# Patient Record
Sex: Female | Born: 1955
Health system: Southern US, Community
[De-identification: ages and names within clinical notes are randomized; demographics above are authoritative.]

## PROBLEM LIST (undated history)

## (undated) DIAGNOSIS — T7840XA Allergy, unspecified, initial encounter: Secondary | ICD-10-CM

## (undated) DIAGNOSIS — R112 Nausea with vomiting, unspecified: Secondary | ICD-10-CM

## (undated) DIAGNOSIS — M1990 Unspecified osteoarthritis, unspecified site: Secondary | ICD-10-CM

## (undated) DIAGNOSIS — J329 Chronic sinusitis, unspecified: Secondary | ICD-10-CM

## (undated) DIAGNOSIS — T07XXXA Unspecified multiple injuries, initial encounter: Secondary | ICD-10-CM

## (undated) DIAGNOSIS — K449 Diaphragmatic hernia without obstruction or gangrene: Secondary | ICD-10-CM

## (undated) DIAGNOSIS — K589 Irritable bowel syndrome without diarrhea: Secondary | ICD-10-CM

## (undated) DIAGNOSIS — K222 Esophageal obstruction: Secondary | ICD-10-CM

## (undated) DIAGNOSIS — Z8489 Family history of other specified conditions: Secondary | ICD-10-CM

## (undated) DIAGNOSIS — F419 Anxiety disorder, unspecified: Secondary | ICD-10-CM

## (undated) DIAGNOSIS — K219 Gastro-esophageal reflux disease without esophagitis: Secondary | ICD-10-CM

## (undated) DIAGNOSIS — R159 Full incontinence of feces: Secondary | ICD-10-CM

## (undated) DIAGNOSIS — M810 Age-related osteoporosis without current pathological fracture: Secondary | ICD-10-CM

## (undated) DIAGNOSIS — F32A Depression, unspecified: Secondary | ICD-10-CM

## (undated) DIAGNOSIS — F1910 Other psychoactive substance abuse, uncomplicated: Secondary | ICD-10-CM

## (undated) DIAGNOSIS — R1310 Dysphagia, unspecified: Secondary | ICD-10-CM

## (undated) DIAGNOSIS — J302 Other seasonal allergic rhinitis: Secondary | ICD-10-CM

## (undated) DIAGNOSIS — M8580 Other specified disorders of bone density and structure, unspecified site: Secondary | ICD-10-CM

## (undated) DIAGNOSIS — R768 Other specified abnormal immunological findings in serum: Secondary | ICD-10-CM

## (undated) DIAGNOSIS — T8859XA Other complications of anesthesia, initial encounter: Secondary | ICD-10-CM

## (undated) DIAGNOSIS — Z9889 Other specified postprocedural states: Secondary | ICD-10-CM

## (undated) DIAGNOSIS — R569 Unspecified convulsions: Secondary | ICD-10-CM

## (undated) DIAGNOSIS — I10 Essential (primary) hypertension: Secondary | ICD-10-CM

## (undated) DIAGNOSIS — G40909 Epilepsy, unspecified, not intractable, without status epilepticus: Secondary | ICD-10-CM

## (undated) DIAGNOSIS — K5790 Diverticulosis of intestine, part unspecified, without perforation or abscess without bleeding: Secondary | ICD-10-CM

## (undated) DIAGNOSIS — T4145XA Adverse effect of unspecified anesthetic, initial encounter: Secondary | ICD-10-CM

## (undated) DIAGNOSIS — F329 Major depressive disorder, single episode, unspecified: Secondary | ICD-10-CM

## (undated) DIAGNOSIS — M069 Rheumatoid arthritis, unspecified: Secondary | ICD-10-CM

## (undated) DIAGNOSIS — M797 Fibromyalgia: Secondary | ICD-10-CM

## (undated) HISTORY — DX: Major depressive disorder, single episode, unspecified: F32.9

## (undated) HISTORY — PX: EYE SURGERY: SHX253

## (undated) HISTORY — PX: RECTOCELE REPAIR: SHX761

## (undated) HISTORY — DX: Allergy, unspecified, initial encounter: T78.40XA

## (undated) HISTORY — DX: Fibromyalgia: M79.7

## (undated) HISTORY — DX: Unspecified convulsions: R56.9

## (undated) HISTORY — DX: Unspecified osteoarthritis, unspecified site: M19.90

## (undated) HISTORY — DX: Other seasonal allergic rhinitis: J30.2

## (undated) HISTORY — DX: Anxiety disorder, unspecified: F41.9

## (undated) HISTORY — DX: Gastro-esophageal reflux disease without esophagitis: K21.9

## (undated) HISTORY — PX: NOSE SURGERY: SHX723

## (undated) HISTORY — DX: Esophageal obstruction: K22.2

## (undated) HISTORY — DX: Depression, unspecified: F32.A

## (undated) HISTORY — PX: TUBAL LIGATION: SHX77

## (undated) HISTORY — DX: Rheumatoid arthritis, unspecified: M06.9

## (undated) HISTORY — DX: Other specified abnormal immunological findings in serum: R76.8

## (undated) HISTORY — PX: OTHER SURGICAL HISTORY: SHX169

## (undated) HISTORY — DX: Unspecified multiple injuries, initial encounter: T07.XXXA

## (undated) HISTORY — PX: BACK SURGERY: SHX140

## (undated) HISTORY — DX: Dysphagia, unspecified: R13.10

## (undated) HISTORY — DX: Other specified disorders of bone density and structure, unspecified site: M85.80

## (undated) HISTORY — DX: Epilepsy, unspecified, not intractable, without status epilepticus: G40.909

## (undated) HISTORY — PX: LAPAROSCOPIC HYSTERECTOMY: SHX1926

## (undated) HISTORY — DX: Age-related osteoporosis without current pathological fracture: M81.0

## (undated) HISTORY — PX: APPENDECTOMY: SHX54

---

## 2001-10-06 ENCOUNTER — Encounter: Payer: Self-pay | Admitting: Obstetrics and Gynecology

## 2001-10-06 ENCOUNTER — Ambulatory Visit (HOSPITAL_COMMUNITY): Admission: RE | Admit: 2001-10-06 | Discharge: 2001-10-06 | Payer: Self-pay | Admitting: Obstetrics and Gynecology

## 2001-10-12 ENCOUNTER — Encounter (HOSPITAL_COMMUNITY): Admission: RE | Admit: 2001-10-12 | Discharge: 2001-11-11 | Payer: Self-pay | Admitting: Rheumatology

## 2002-06-19 ENCOUNTER — Ambulatory Visit (HOSPITAL_COMMUNITY): Admission: RE | Admit: 2002-06-19 | Discharge: 2002-06-19 | Payer: Self-pay | Admitting: Family Medicine

## 2002-06-19 ENCOUNTER — Encounter: Payer: Self-pay | Admitting: Family Medicine

## 2004-10-16 ENCOUNTER — Ambulatory Visit: Payer: Self-pay | Admitting: Internal Medicine

## 2004-10-20 ENCOUNTER — Ambulatory Visit (HOSPITAL_COMMUNITY): Admission: RE | Admit: 2004-10-20 | Discharge: 2004-10-20 | Payer: Self-pay | Admitting: Internal Medicine

## 2004-10-20 ENCOUNTER — Ambulatory Visit: Payer: Self-pay | Admitting: Internal Medicine

## 2006-07-23 ENCOUNTER — Ambulatory Visit: Payer: Self-pay | Admitting: Internal Medicine

## 2006-07-23 ENCOUNTER — Emergency Department (HOSPITAL_COMMUNITY): Admission: EM | Admit: 2006-07-23 | Discharge: 2006-07-23 | Payer: Self-pay | Admitting: Emergency Medicine

## 2006-07-29 ENCOUNTER — Ambulatory Visit (HOSPITAL_COMMUNITY): Admission: RE | Admit: 2006-07-29 | Discharge: 2006-07-29 | Payer: Self-pay | Admitting: Internal Medicine

## 2006-07-29 ENCOUNTER — Ambulatory Visit: Payer: Self-pay | Admitting: Internal Medicine

## 2006-10-27 ENCOUNTER — Ambulatory Visit (HOSPITAL_COMMUNITY): Admission: RE | Admit: 2006-10-27 | Discharge: 2006-10-27 | Payer: Self-pay | Admitting: Family Medicine

## 2007-03-15 ENCOUNTER — Ambulatory Visit: Payer: Self-pay | Admitting: Internal Medicine

## 2007-03-30 ENCOUNTER — Ambulatory Visit (HOSPITAL_COMMUNITY): Admission: RE | Admit: 2007-03-30 | Discharge: 2007-03-30 | Payer: Self-pay | Admitting: Family Medicine

## 2007-05-24 ENCOUNTER — Ambulatory Visit (HOSPITAL_COMMUNITY): Admission: RE | Admit: 2007-05-24 | Discharge: 2007-05-24 | Payer: Self-pay | Admitting: Family Medicine

## 2008-03-13 ENCOUNTER — Ambulatory Visit (HOSPITAL_COMMUNITY): Admission: RE | Admit: 2008-03-13 | Discharge: 2008-03-13 | Payer: Self-pay | Admitting: Family Medicine

## 2008-09-25 ENCOUNTER — Encounter: Admission: RE | Admit: 2008-09-25 | Discharge: 2008-09-25 | Payer: Self-pay | Admitting: Neurosurgery

## 2008-10-23 ENCOUNTER — Inpatient Hospital Stay (HOSPITAL_COMMUNITY): Admission: RE | Admit: 2008-10-23 | Discharge: 2008-10-26 | Payer: Self-pay | Admitting: Neurosurgery

## 2008-10-31 ENCOUNTER — Emergency Department (HOSPITAL_COMMUNITY): Admission: EM | Admit: 2008-10-31 | Discharge: 2008-10-31 | Payer: Self-pay | Admitting: Emergency Medicine

## 2009-04-23 ENCOUNTER — Emergency Department (HOSPITAL_COMMUNITY): Admission: EM | Admit: 2009-04-23 | Discharge: 2009-04-23 | Payer: Self-pay | Admitting: Emergency Medicine

## 2009-05-05 ENCOUNTER — Telehealth (INDEPENDENT_AMBULATORY_CARE_PROVIDER_SITE_OTHER): Payer: Self-pay

## 2009-05-08 ENCOUNTER — Telehealth (INDEPENDENT_AMBULATORY_CARE_PROVIDER_SITE_OTHER): Payer: Self-pay

## 2009-05-16 DIAGNOSIS — K219 Gastro-esophageal reflux disease without esophagitis: Secondary | ICD-10-CM | POA: Insufficient documentation

## 2009-05-16 DIAGNOSIS — R109 Unspecified abdominal pain: Secondary | ICD-10-CM | POA: Insufficient documentation

## 2009-05-16 DIAGNOSIS — R1110 Vomiting, unspecified: Secondary | ICD-10-CM

## 2009-05-16 DIAGNOSIS — M949 Disorder of cartilage, unspecified: Secondary | ICD-10-CM

## 2009-05-16 DIAGNOSIS — F329 Major depressive disorder, single episode, unspecified: Secondary | ICD-10-CM

## 2009-05-16 DIAGNOSIS — K319 Disease of stomach and duodenum, unspecified: Secondary | ICD-10-CM

## 2009-05-16 DIAGNOSIS — J301 Allergic rhinitis due to pollen: Secondary | ICD-10-CM | POA: Insufficient documentation

## 2009-05-16 DIAGNOSIS — M899 Disorder of bone, unspecified: Secondary | ICD-10-CM | POA: Insufficient documentation

## 2009-05-16 DIAGNOSIS — R110 Nausea: Secondary | ICD-10-CM

## 2009-05-16 DIAGNOSIS — K449 Diaphragmatic hernia without obstruction or gangrene: Secondary | ICD-10-CM | POA: Insufficient documentation

## 2009-05-20 ENCOUNTER — Telehealth (INDEPENDENT_AMBULATORY_CARE_PROVIDER_SITE_OTHER): Payer: Self-pay

## 2009-11-23 ENCOUNTER — Emergency Department (HOSPITAL_COMMUNITY): Admission: EM | Admit: 2009-11-23 | Discharge: 2009-11-23 | Payer: Self-pay | Admitting: Emergency Medicine

## 2010-02-22 ENCOUNTER — Encounter: Payer: Self-pay | Admitting: Family Medicine

## 2010-03-05 NOTE — Progress Notes (Signed)
Summary: phone note  Phone Note Call from Patient   Caller: Patient Summary of Call: Pt said she is having some trouble at times with swallowing and coughs. She wants to know if she might could be inflamed in her esophagus and would need some antibiotics. She has appt here 05/19/2009. I told her she should see her PCP, who is Dr. Phillips Odor.  Initial call taken by: Cloria Spring LPN,  May 08, 2009 3:08 PM

## 2010-03-05 NOTE — Progress Notes (Signed)
Summary: pt vomiting/ very sick per sister  Phone Note Call from Patient   Caller: Pt's sister/ Luna Kitchens Summary of Call: Pt's sister, Dorisann Frames and said she is very concerned about the pt. Said she was seen in ER recently and referred to our office. ( Has appt with LL on 05/19/2009) She said the pt's dx was alcohol abuse and elevated LFT's. She said she just spoke with pt and said she is very sick, said she has vomited over a 5 gal can all over wall, floor, etc. I told her she  should go to the ER  @ once if she has vomited that much. She asked could we get her in earlier, and i told her we would probably be notified from ER, and we would see what we could do. York Spaniel she knows her sister's life style is not good but it is still her sister and she is concerned about her). Initial call taken by: Cloria Spring LPN,  May 05, 2009 4:16 PM

## 2010-03-05 NOTE — Progress Notes (Signed)
Summary: PT DOES NOT WANT ANY INFO SHARED WITH SISTER JEAN STEVENSON  Phone Note Call from Patient   Caller: Patient Summary of Call: VM FROM PT, SAID SHE DOES NOT WANT ANY OF HER INFORMATION SHARED WITH HER SISTER, JEAN STEVENSON. Initial call taken by: Cloria Spring LPN,  May 20, 2009 8:33 AM     Appended Document: PT DOES NOT WANT ANY INFO SHARED WITH SISTER JEAN STEVENSON OK, we need to put HIPAA flag on chart AND in computer please

## 2010-04-27 LAB — COMPREHENSIVE METABOLIC PANEL
AST: 257 U/L — ABNORMAL HIGH (ref 0–37)
CO2: 24 mEq/L (ref 19–32)
Calcium: 9.3 mg/dL (ref 8.4–10.5)
Chloride: 99 mEq/L (ref 96–112)
Creatinine, Ser: 0.75 mg/dL (ref 0.4–1.2)
GFR calc Af Amer: >60 mL/min (ref >60)
GFR calc non Af Amer: >60 mL/min (ref >60)
Glucose, Bld: 94 mg/dL (ref 70–99)
Total Bilirubin: 0.7 mg/dL (ref 0.3–1.2)

## 2010-04-27 LAB — URINALYSIS, ROUTINE W REFLEX MICROSCOPIC
Bilirubin Urine: NEGATIVE
Hgb urine dipstick: NEGATIVE
Ketones, ur: NEGATIVE mg/dL
Nitrite: NEGATIVE
Protein, ur: NEGATIVE mg/dL
Specific Gravity, Urine: 1.005 (ref 1.005–1.030)
Urobilinogen, UA: 0.2 mg/dL (ref 0.0–1.0)

## 2010-04-27 LAB — CBC
HCT: 38.2 % (ref 36.0–46.0)
Hemoglobin: 13.5 g/dL (ref 12.0–15.0)
MCHC: 35.3 g/dL (ref 30.0–36.0)
MCV: 94.9 fL (ref 78.0–100.0)
RBC: 4.02 MIL/uL (ref 3.87–5.11)
WBC: 5.2 10*3/uL (ref 4.0–10.5)

## 2010-04-27 LAB — URINE MICROSCOPIC-ADD ON

## 2010-04-27 LAB — DIFFERENTIAL
Basophils Relative: 1 % (ref 0–1)
Eosinophils Absolute: 0.1 10*3/uL (ref 0.0–0.7)
Eosinophils Relative: 2 % (ref 0–5)
Lymphs Abs: 1.7 10*3/uL (ref 0.7–4.0)
Monocytes Absolute: 0.5 10*3/uL (ref 0.1–1.0)
Monocytes Relative: 10 % (ref 3–12)

## 2010-05-08 LAB — TYPE AND SCREEN: ABO/RH(D): O POS

## 2010-05-08 LAB — DIFFERENTIAL
Basophils Relative: 1 % (ref 0–1)
Lymphs Abs: 0.8 10*3/uL (ref 0.7–4.0)
Monocytes Absolute: 0.8 10*3/uL (ref 0.1–1.0)
Monocytes Relative: 16 % — ABNORMAL HIGH (ref 3–12)
Neutro Abs: 2.9 10*3/uL (ref 1.7–7.7)

## 2010-05-08 LAB — BASIC METABOLIC PANEL
BUN: 4 mg/dL — ABNORMAL LOW (ref 6–23)
BUN: 8 mg/dL (ref 6–23)
Calcium: 9 mg/dL (ref 8.4–10.5)
Chloride: 101 mEq/L (ref 96–112)
Creatinine, Ser: 0.61 mg/dL (ref 0.4–1.2)
Creatinine, Ser: 0.81 mg/dL (ref 0.4–1.2)
GFR calc Af Amer: >60 mL/min (ref >60)
GFR calc non Af Amer: >60 mL/min (ref >60)
GFR calc non Af Amer: >60 mL/min (ref >60)
Glucose, Bld: 103 mg/dL — ABNORMAL HIGH (ref 70–99)
Potassium: 4.2 mEq/L (ref 3.5–5.1)

## 2010-05-08 LAB — ABO/RH: ABO/RH(D): O POS

## 2010-05-08 LAB — CBC
HCT: 38.4 % (ref 36.0–46.0)
Platelets: 187 10*3/uL (ref 150–400)
Platelets: 447 10*3/uL — ABNORMAL HIGH (ref 150–400)
RBC: 3.96 MIL/uL (ref 3.87–5.11)
RDW: 13 % (ref 11.5–15.5)
WBC: 4.6 10*3/uL (ref 4.0–10.5)
WBC: 4.9 10*3/uL (ref 4.0–10.5)

## 2010-05-08 LAB — RAPID URINE DRUG SCREEN, HOSP PERFORMED
Amphetamines: NOT DETECTED
Cocaine: POSITIVE — AB
Tetrahydrocannabinol: NOT DETECTED

## 2010-05-08 LAB — ETHANOL: Alcohol, Ethyl (B): <5 mg/dL (ref 0–10)

## 2010-06-04 ENCOUNTER — Emergency Department (HOSPITAL_COMMUNITY)
Admission: EM | Admit: 2010-06-04 | Discharge: 2010-06-04 | Disposition: A | Discharge status: Home / Self Care | Payer: Medicare Other | Attending: Emergency Medicine | Admitting: Emergency Medicine

## 2010-06-04 DIAGNOSIS — F411 Generalized anxiety disorder: Secondary | ICD-10-CM | POA: Insufficient documentation

## 2010-06-04 DIAGNOSIS — K219 Gastro-esophageal reflux disease without esophagitis: Secondary | ICD-10-CM | POA: Insufficient documentation

## 2010-06-04 DIAGNOSIS — M129 Arthropathy, unspecified: Secondary | ICD-10-CM | POA: Insufficient documentation

## 2010-06-04 DIAGNOSIS — G8929 Other chronic pain: Secondary | ICD-10-CM | POA: Insufficient documentation

## 2010-06-04 DIAGNOSIS — G40909 Epilepsy, unspecified, not intractable, without status epilepticus: Secondary | ICD-10-CM | POA: Insufficient documentation

## 2010-06-04 DIAGNOSIS — M549 Dorsalgia, unspecified: Secondary | ICD-10-CM | POA: Insufficient documentation

## 2010-06-04 DIAGNOSIS — Z79899 Other long term (current) drug therapy: Secondary | ICD-10-CM | POA: Insufficient documentation

## 2010-06-04 DIAGNOSIS — L259 Unspecified contact dermatitis, unspecified cause: Secondary | ICD-10-CM | POA: Insufficient documentation

## 2010-06-16 NOTE — Op Note (Signed)
NAME:  [name], [name]              ACCOUNT NO.:  [id number]   MEDICAL RECORD NO.:  [id number]          PATIENT TYPE:  AMB   LOCATION:  DAY                           FACILITY:  [facility]   PHYSICIAN:  R. Roetta Sessions, M.D. DATE OF BIRTH:  [birth year]   DATE OF PROCEDURE:  07/29/2006  DATE OF DISCHARGE:                               OPERATIVE REPORT   PROCEDURE:  EGD with balloon dilation.   ENDOSCOPIST:  Jonathon Bellows, MD.   INDICATIONS FOR PROCEDURE:  The patient is a 55 year old lady with a  Schatzki's ring.  This has given her a food impaction.  She was seen  emergently by me last weekend and was disimpacted.  She returns for EGD  with an elective dilation.  This approach has been discussed with the  patient, the potential risks, benefits, and alternatives have been  reviewed, questions answered, and she is agreeable.  Please the  documentation in the medical record.   PROCEDURE NOTE:  O2 saturation, blood pressure, pulse, and respirations  were monitored throughout the entire procedure, conscious sedation with  Versed 5 mg IV and Demerol 125 mg IV in divided doses, Cetacaine spray  for topical oropharyngeal anesthesia, and a Pentax video chip system.   FINDINGS:  Examination of the tubular esophagus again revealed a  Schatzki's ring.  There were also some distal esophageal erosions as  well.  There was no Barrett's esophagus.  The EG junction was easily  traversed entering the stomach and colon, and the gastric cavity was  empty.  __________ examination of the gastric mucosa.  Retroflexion of  the proximal stomach and esophagogastric junction demonstrated only a  small hiatal hernia, the pylorus was patent and easily traversed,  examination of the bulb and second portion revealed no abnormalities.   THERAPEUTIC/DIAGNOSTIC MANEUVERS PERFORMED:  The scope was withdrawn,  and a #56 [place] Maloney dilator was passed to full insertion with some  slight resistance.  Upon full  insertion revealed the ring had been  disrupted, and there was a 3 cm superficial tear extending from the  level of the ring proximally.  This proved to be superficial.  The  patient tolerated the procedure well.   IMPRESSION:  Schatzki's ring, status post dilation and disruption as  described above.  Otherwise, distal esophageal erosions consistent with  mild erosive reflux esophagitis, otherwise normal esophagus with a small  hiatal hernia, otherwise a normal stomach, D1 and D2.   The patient takes Nexium 20 mg orally a day.  I have asked her to  increase the Nexium to 40 mg orally daily, Carafate 1 gm Slurries q.i.d.  x5 days, Lortab Elixir 7.5 mg per 5 ml, 50 ml dispensed, one teaspoon  orally q.6h p.r.n. transient discomfort, a clear liquid diet only today,  advance as tolerated tomorrow.  Will plan to see this nice lady back in  the office in three months to see how things are going.      Jonathon Bellows, M.D.  Electronically Signed     RMR/MEDQ  D:  07/29/2006  T:  07/29/2006  Job:  [id number]  cc:   Halford Chessman, M.D.  Fax: [phone]

## 2010-06-16 NOTE — Op Note (Signed)
NAME:  [name] [name], [name] [name] NO.:  [id number]   MEDICAL RECORD NO.:  [id number]          PATIENT TYPE:  EMS   LOCATION:  ED                            FACILITY:  [facility]   PHYSICIAN:  R. Roetta Sessions, M.D. DATE OF BIRTH:  [birth year]   DATE OF PROCEDURE:  07/23/2006  DATE OF DISCHARGE:                               OPERATIVE REPORT   PROCEDURE:  Emergency EGD with esophageal food impaction removal.   INDICATIONS FOR PROCEDURE:  The patient is a 55 year old lady with  chronic esophageal dysphagia, history of a known Schatzki's ring  requiring dilation in the past.  She was eating sandwich today at  lunchtime, swallowed a bolus and felt it catch behind her breast.  She  has not been able to even  swallow saliva since that time.  She came to  see Dr. Margretta Ditty in the ED, who called me.  An emergency EGD with food  disimpaction now being performed.  This approach has been discussed with  the patient at length.  The potential risks, benefits and alternatives  have been reviewed and questions answered.  She is agreeable.  Please  see entire history in the medical record.   PROCEDURE NOTE:  Oxygen saturation, blood pressure, pulse and  respirations were monitored throughout the entire procedure.  Conscious  sedation was with Versed 3 mg IV and Demerol 75 mg IV in divided doses.  Cetacaine spray gargled and swallowed.   INSTRUMENT:  Pentax video chip system.   FINDINGS:  The esophageal compartment was easily intubated.  There was  food bolus and quite a bit of small food debris ball-valving in the  distal esophagus.  Some of this was quite fluid and was suctioned  through the scope.  We approximated the tip of the scope down to the  obstructing bolus, and with gentle pressure  easily pushed and avulsed  across the EG junction into the stomach.  There was quite a bit of food  in the stomach.  Complete examination was not carried out.  The patient  had a hiatal hernia near the  Schatzki's ring.  The tubular esophagus was  opened up with this maneuver without difficulty.  I saw a Schatzki's  ring and nothing else.  I really did not see a V-line esophagus or any  rings, aside from the ring at the EG junction.  The patient tolerated  the procedure well and was reacted in endoscopy.   IMPRESSION:  1. Esophageal food impaction, status post disimpaction as described      above.  2. Schatzki's ring.  3. Hiatal hernia.  4. Complete exam not carried out.   RECOMMENDATIONS:  1. Continue Nexium 40 mg orally daily.  2. Full liquid diet.  3. Will make arrangements for an elective EGD with esophageal dilation      in the near future.      Jonathon Bellows, M.D.  Electronically Signed     RMR/MEDQ  D:  07/23/2006  T:  07/23/2006  Job:  [id number]   cc:   Dr. Phillips Odor

## 2010-06-16 NOTE — Assessment & Plan Note (Signed)
NAME:  [name] [name], [name] [name]               CHART#:  [id number]   DATE:  03/15/2007                       DOB:  03-26-1955   OFFICE FOLLOWUP:  Followup GERD.  History of peptic stricture,  Schatzki's ring, status post dilation summer 2008.  Ms. Pitones has had  problems with her insurance.  They will not pay for the EGD or hospital  charges given it was a pre-existing condition.  She has not been able  to get any acid suppression therapy either because of high copays or no  copays.  She has been taking over-the-counter acid blocker agents and  taking her husband's Prevacid.  She has intermittent esophageal  dysphagia and transient food impactions on the order of a couple of  times monthly.  She has gained 10 pounds over the past year or so.  She  underwent an EGD with Elease Hashimoto dilation (56 [place]) back on 07/29/2006.  She ran out of Nexium months ago.   CURRENT MEDICATIONS:  See updated list.   ALLERGIES:  Sulfa, penicillin.   EXAMINATION:  Today no acute distress.  Weight 168, height 5 feet 3  inches, temperature 98.1, blood pressure 122/80 and pulse 80.  SKIN:  Warm and dry.  CHEST:  Lungs are clear to auscultation.  HEART:  Regular rate and rhythm without murmur, gallop, rub.  ABDOMEN:  Nondistended.  Positive bowel sounds.  Soft, nontender without  appreciable mass or organomegaly.  EXTREMITIES:  No edema.   ASSESSMENT:  Poorly controlled complicated gastroesophageal reflux  disease.  History of peptic stricture/ring.  I stressed the importance  of being on acid suppression therapy with a proton pump inhibitor  indefinitely.  I have given her 30 days' worth of Zegerid 40 mg once  daily.  Apparently her insurance will provide good benefit for generic.  Start her on some Protonix 40 mg early daily #30 with 5 months refills.  Unless something comes up, plan to see this nice lady  back in six months.  Should her dysphagia worsen, she is to let me know  and she may need a repeat EGD with  dilation.       Jonathon Bellows, M.D.  Electronically Signed     RMR/MEDQ  D:  03/15/2007  T:  03/16/2007  Job:  [id number]   cc:   Patrica Duel, M.D.

## 2010-06-19 NOTE — Consult Note (Signed)
NAME:  [name] [name], [name] [name]              ACCOUNT NO.:  [id number]   MEDICAL RECORD NO.:  [id number]         PATIENT TYPE:  AMB   LOCATION:                                FACILITY:  [facility]   PHYSICIAN:  R. Roetta Sessions, M.D. DATE OF BIRTH:  12-06-1955   DATE OF CONSULTATION:  DATE OF DISCHARGE:                                   CONSULTATION   REQUESTING PHYSICIAN:  Melony Overly, PA and Dr. Nobie Putnam.   REASON FOR CONSULTATION:  Dysphagia, intractable belching, refractory  heartburn and rectal bleeding.   HISTORY OF PRESENT ILLNESS:  [name] [name] is a 55 year old Caucasian female  referred through Dr. Geanie Logan office with a 57-month history of intractable  belching.  She also has a history of intermittent dysphagia with both solids  and liquids with subsequent regurgitation.  She complains of sternal  pressure with swallowing that generally lasts anywhere from minutes to hours  until she regurgitates her food.  She is having episodes once every couple  of weeks of this.  She also complains of refractory heartburn and  indigestion.  She has been tried on Nexium, Prilosec and Aciphex, all with  minimal relief.  She has never had an EGD.  She has had chronic GERD  symptoms for about 10 years.   She also complains of noticing burgundy-colored blood in her stool on at  least three occasions along with some crampy left-sided abdominal pain.  She  denies any clots.  She does have a history of chronic constipation and is  taking stool softeners 2 per day as well as an over-the-counter fiber  supplement.  She denies any history of diarrhea.   PAST MEDICAL HISTORY:  Epilepsy, depression, seasonal allergies, chronic  GERD, osteopenia, nasal reconstruction, eye surgery, tubal ligation.  She  believes she has had a complete hysterectomy but she is unsure and  appendectomy at age 55.  Hiatal hernia.   CURRENT MEDICATIONS:  1.  Primidone 250 mg t.i.d.  2.  __________ Depot 7.5 mg t.i.d.  3.   Flexeril 10 mg t.i.d.  4.  Stool softeners 2 daily.  5.  [employer] fiber supplement once daily.   ALLERGIES:  PENICILLIN and SULFA.   FAMILY HISTORY:  No known family history of colorectal carcinoma, liver or  chronic GI problems.  Mother deceased at age 66 secondary to Alzheimer's.  Father deceased at age 43 secondary to leukemia.  She has two sisters, one  with coronary artery disease and arthritis.   SOCIAL HISTORY:  Mrs. Geisler is currently in her fourth marriage and has  been married for 3 years.  She has one grown, healthy child.  She is  employed full time with [employer].  She denies any tobacco or drug use.  She reports occasional alcohol use.   REVIEW OF SYSTEMS:  CONSTITUTIONAL:  Weight is steadily increasing.  She is  complaining of some fatigue.  She denies any fever or chills.  She denies  any early satiety.  CARDIOVASCULAR:  See HPI.  She denies any palpitations.  PULMONARY:  Denies shortness of breath, dyspnea, cough or hemoptysis.  GI:  See HPI.   PHYSICAL EXAMINATION:  VITAL SIGNS:  Weight 158.5 pounds, height 63 inches,  temperature 98.2, blood pressure 112/68.  Pulse 96.  GENERAL:  [name] [name] is a 55 year old Caucasian female who throughout the  exam continues to have intractable belching.  She is alert, oriented,  pleasant and cooperative.  HEENT:  Sclerae are clear and nonicteric.  Conjunctivae are pink.  Oropharynx pink and moist without any lesions.  NECK:  Supple without any mass or thyromegaly.  CHEST/HEART:  Regular rate and rhythm.  Normal S1, S2 without murmurs, rubs  or gallops.  LUNGS:  Clear to auscultation bilaterally.  ABDOMEN:  Positive bowel sounds times four.  No bruits are auscultated.  Soft, nondistended.  She does have mild tenderness of the left lower  quadrant on deep palpation.  No rebound tenderness or guarding.  EXTREMITIES:  Without any edema or clubbing bilaterally.  SKIN:  Pink, warm and dry without rash or jaundice.    IMPRESSION:  [name] [name] is a 55 year old Caucasian female with several  gastrointestinal complaints today.  She has refractory heartburn,  intractable belching on a daily basis and intermittent dysphagia to both  solids and liquids with regurgitation for at least the last 3 months.  Her  symptoms will definitely require further evaluation, given her 10-year  history of chronic gastroesophageal reflux disease.  I suspect she may have  developed esophageal web ring or stricture.  She may also be having  complications from a large hiatal hernia.   As far as her rectal bleeding is concerned, I suspect either a hemorrhoidal  or diverticular source.  Nonetheless, she is going to need colonoscopy for  further evaluation to rule out polyps or neoplasm.   PLAN:  1.  Zegerid 40 mg daily.  Two weeks' worth of samples were given.  2.  We will schedule a colonoscopy and EGD with Dr. Jena Gauss in the near      future.  I have discussed both procedures including risks and benefits      to include but not limited to bleeding, infection, perforation, drug      reaction.  She agrees with this plan.  Consent will be obtained.  3.  Further recommendations to follow.   We would like to thank Melony Overly, [facility] and Dr. Nobie Putnam for allowing [place] to  participate in the care of Mrs. Hinchey.      Nicholas Lose, N.P.      Jonathon Bellows, M.D.  Electronically Signed    KC/MEDQ  D:  10/16/2004  T:  10/16/2004  Job:  [id number]   cc:   Patrica Duel, M.D.  [address]  [place]  [place] [address]  Fax: [phone]

## 2010-06-19 NOTE — Consult Note (Signed)
[name] [name], EASTLICK NO.:  [id number]   MEDICAL RECORD NO.:  [id number]                   PATIENT TYPE:   LOCATION:                                       FACILITY:   PHYSICIAN:  Aundra Dubin, M.D.            DATE OF BIRTH:   DATE OF CONSULTATION:  10/12/2001  DATE OF DISCHARGE:                                   CONSULTATION   CHIEF COMPLAINT:  Aching in ankles, legs, and all over.   Thank you for allowing me to help in the patient's care.  She is a 55-year-  old white female with several varied musculoskeletal problems.  One of the  worst problems is at night she wakes up aching and hurting in her ankles.  They feel as if they are exploding.  She is a fairly avid walker and walks  30-45 minutes three times a week.  She has been aching in her shins.  Generally, the shins warm up and do not bother her much while she is walking  and hurt the next day.  She has noticed that she is significantly stiff  after driving the one hour to work.  She does not sleep well and wakes up  several times throughout the night.  When she arises, she is tired.  She has  low energy and says that she does not have any.  She frequently has  headaches and says I keep a low-grade headache.  Sometimes, she has worse  headaches that she describes as migraines.  She has constipation frequently  but denies diarrhea, mucus or blood to the bowel movement.   On further review of systems, there has been no fever, rashes, psoriasis, or  weight loss.  She feels her weight has increased by about 5 pounds over the  last few months.  There has been no photosensitivity, oral ulcers, alopecia,  pleurisy, or Raynaud's.  She denies chest pain or shortness of breath.   PAST MEDICAL/SURGICAL HISTORY:  Seizures, tubal ligation in 1973,  hysterectomy in 1986, double rectocele repair in 1997, nasal surgery in  1969, tonsillectomy, a long history of hematuria without a full cause being  determined.  She is undergoing a recent subspecialist workup.   MEDICATIONS:  1. Lactal 100 mg q.d.  2. Zoloft 100 mg q.d.  3. Topamax 25 mg p.r.n.  4. Tranxene 7.5 mg p.r.n.  5. Mysoline 250 mg q.d.  6. Nexium 40 mg q.d.  7. Vicodin p.r.n.  8. Ibuprofen 400 mg t.i.d.   DRUG INTOLERANCES:  1. SULFA.  2. PENICILLIN.   FAMILY HISTORY:  Her father died at age 11 with complications of pneumonia  and leukemia.  Her mother is 68 years of age and has Alzheimer's.   SOCIAL HISTORY:  She has lived most of her life in [place].  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  She did this for about 18  months.  She is now married to her fourth husband for the past year.  She  never smoked and does not drink alcohol.   PHYSICAL EXAMINATION:  VITAL SIGNS:  Weight 157 pounds, height 5 feet 3  inches.  Blood pressure 110/70, respirations 16.  GENERAL:  No distress.  She is quite pleasant and laughs frequently.  SKIN:  Clear.  HEENT:  Normal hair pattern.  PERRL/EOMI.  Mouth clear.  NECK:  Slight upper cervical adenopathy.  Normal thyroid.  LUNGS:  Clear.  HEART:  Regular.  No murmur.  ABDOMEN:  Soft.  Negative HSM.  MUSCULOSKELETAL:  The hands, wrists, elbows, shoulders, neck, back, hips,  knees all have a painless full range of motion, are cool and nontender.  The  trigger points at the elbows, shoulders, neck, occiput, anterior chest, and  upper paraspinous muscles were moderately tender.  The ankles show pronation  moderately severely.  Palpation of the anterior shins is quite tender,  particularly as I move distally.  The feet showed no swelling and are  nontender.  NEUROLOGIC:  Strength is 5/5.  DTR's are 2+ throughout,  negative SLR's.  She could forward flex and touch her toes.   ASSESSMENT AND PLAN:  1. Polyarthralgia and insomnia.  She has some degree of fibromyalgia-type     symptoms.  She  has had problems using Xanax in the past.  I elected to     place her on Flexeril 2.5 mg h.s.  She can increase to 10-15 mg p.r.n. as     tolerated.  I have discussed symptoms of fibromyalgia with her.  She is     an exerciser, which is quite good.  2. Suspected shin splints.  This is a difficult problem to fully effect     quickly.  I have asked her to stop walking for at least one month.  I     have greatly encouraged her to ride a stationary bicycle, which she does     have access to.  I believe she can continue exercising but reduce the     stress to her shins.  Also, I discussed icing the shins for 15 minutes.  3. Ankle pronation.  I believe this is a structural problem.  She will try     an elastic brace to see if this will help.  If this is offering no help,     then I would advise her to consult a podiatrist.  4. Hysterectomy.  5. Seizures.   John, I have enjoyed meeting and working with the patient today.  Overall, I  think she is healthy.  I believe if we see some improvement with her sleep,  some of this achiness with improve.  Also, the shin splints and ankle  difficulties are separate problems.  I will see her back in two months.  She  has requested to be seen in my [place] office on a Friday, as this is her  day off.                                                Aundra Dubin, M.D.    WWT/MEDQ  D:  10/12/2001  T:  10/12/2001  Job:  [id number]  cc:   Corrie Mckusick, M.D.  [address]  [place]  [place] [address]  Fax: [phone]

## 2010-06-19 NOTE — Op Note (Signed)
NAME:  [name], [name]              ACCOUNT NO.:  [id number]   MEDICAL RECORD NO.:  [id number]          PATIENT TYPE:  AMB   LOCATION:  DAY                           FACILITY:  [facility]   PHYSICIAN:  R. Roetta Sessions, M.D. DATE OF BIRTH:  [birth year]   DATE OF PROCEDURE:  10/20/2004  DATE OF DISCHARGE:                                 OPERATIVE REPORT   PROCEDURE:  Esophagogastroduodenoscopy with Elease Hashimoto dilation followed by  diagnostic colonoscopy.   INDICATIONS FOR PROCEDURE:  The patient is a 55 year old lady with  intermittent esophageal dysphagia to solids, intractable heartburn/reflux  symptoms recently. She has also had intermittent low volume painless  hematochezia. She is here for EGD with possible esophageal dilation and  colonoscopy. This approach has been discussed with the patient at length.  Potential risks, benefits, and alternatives have been reviewed and questions  answered. She is agreeable. Please see documentation in the medical record.   PROCEDURE NOTE:  O2 saturation, blood pressure, pulse, and respirations were  monitored throughout the entirety of both procedures. Conscious sedation  with Versed 9 mg IV and Demerol 125 mg IV in divided doses. Cetacaine spray  for topical oropharyngeal anesthesia.   INSTRUMENT:  Olympus video chip system.   FINDINGS:  ESOPHAGOGASTRODUODENOSCOPY:  Examination of the tubular esophagus  revealed four-quadrant distal linear esophageal erosions, the longest being  approximately 2 cm in length. This was superimposed on a soft noncritical  appearing peptic stricture. There was no evidence of Barrett's esophagus or  neoplasia. EG junction was easily traversed.   Stomach:  Gastric cavity was empty and insufflated well with air. Thorough  examination of gastric mucosa including retroflexed view of the proximal  stomach and esophagogastric junction demonstrated only a small hiatal  hernia. Pylorus patent and easily traversed. Examination of  bulb and second  portion revealed no abnormalities.   THERAPEUTIC/DIAGNOSTIC MANEUVERS:  A 56-French Maloney dilator was passed to  full insertion without resistance and without blood returned on the dilator.  A look back revealed a slight tear at the EG junction. The stricture had  been successfully dilated without apparent complication.   The patient tolerated the procedure well and was prepared for colonoscopy.   COLONOSCOPY:  Digital rectal exam revealed no abnormalities.   ENDOSCOPIC FINDINGS:  Prep was good.   Rectum:  Examination of the rectal mucosa including retroflexed view of the  anal verge revealed only internal hemorrhoids,   Colon:  Colonic mucosa was surveyed from the rectosigmoid junction through  the left, transverse, and right colon to the area of the appendiceal  orifice, ileocecal valve, and cecum. These structures were well seen and  photographed for the record. From this level, the scope was slowly  withdrawn, and all previously mentioned mucosal surfaces were again seen.  The colonic mucosa appeared normal.   The patient tolerated both procedures well and was reactive to endoscopy.   IMPRESSION:  ESOPHAGOGASTRODUODENOSCOPY:  Distal esophageal erosions  consistent with moderately severe erosive reflux esophagitis. Soft peptic  stricture status post dilation as described above. Otherwise normal  esophagus. Small hiatal hernia. Otherwise normal stomach. Normal D1  and D2.   COLONOSCOPY:  Internal hemorrhoids. Otherwise normal rectum and colon.   RECOMMENDATIONS:  1.  Increase Zegerid to 40 mg orally b.i.d.  2.  Anti-reflux literature provided to [name] [name].  3.  Hemorrhoid literature provided to [name] [name].  4.  Ten-day course of Anusol HC suppositories, one per rectum at bedtime.  5.  Follow-up appointment with me in the office in eight weeks.      Jonathon Bellows, M.D.  Electronically Signed     RMR/MEDQ  D:  10/20/2004  T:  10/20/2004  Job:   [id number]   cc:   Patrica Duel, M.D.  [address], [address]  [place]  [place] [address]  Fax: [phone]

## 2010-08-19 ENCOUNTER — Encounter: Payer: Self-pay | Admitting: *Deleted

## 2010-08-19 ENCOUNTER — Emergency Department (HOSPITAL_COMMUNITY)
Admission: EM | Admit: 2010-08-19 | Discharge: 2010-08-19 | Disposition: A | Discharge status: Home / Self Care | Payer: Medicare Other | Attending: Emergency Medicine | Admitting: Emergency Medicine

## 2010-08-19 DIAGNOSIS — F1010 Alcohol abuse, uncomplicated: Secondary | ICD-10-CM | POA: Insufficient documentation

## 2010-08-19 LAB — DIFFERENTIAL
Basophils Absolute: 0 10*3/uL (ref 0.0–0.1)
Basophils Relative: 0 % (ref 0–1)
Lymphocytes Relative: 42 % (ref 12–46)
Neutro Abs: 2.3 10*3/uL (ref 1.7–7.7)
Neutrophils Relative %: 48 % (ref 43–77)

## 2010-08-19 LAB — ETHANOL: Alcohol, Ethyl (B): 241 mg/dL — ABNORMAL HIGH (ref 0–11)

## 2010-08-19 LAB — RAPID URINE DRUG SCREEN, HOSP PERFORMED
Amphetamines: NOT DETECTED
Benzodiazepines: POSITIVE — AB
Opiates: NOT DETECTED

## 2010-08-19 LAB — BASIC METABOLIC PANEL
CO2: 26 mEq/L (ref 19–32)
Chloride: 94 mEq/L — ABNORMAL LOW (ref 96–112)
GFR calc Af Amer: >60 mL/min (ref >60)
Potassium: 3.8 mEq/L (ref 3.5–5.1)
Sodium: 130 mEq/L — ABNORMAL LOW (ref 135–145)

## 2010-08-19 LAB — CBC
HCT: 38.4 % (ref 36.0–46.0)
Platelets: 221 10*3/uL (ref 150–400)
RDW: 12.3 % (ref 11.5–15.5)
WBC: 4.8 10*3/uL (ref 4.0–10.5)

## 2010-08-19 MED ORDER — PANTOPRAZOLE SODIUM 40 MG PO TBEC
40.0000 mg | DELAYED_RELEASE_TABLET | Freq: Once | ORAL | Status: AC
  Administered 2010-08-19: 40 mg via ORAL
  Filled 2010-08-19: qty 1

## 2010-08-19 MED ORDER — ONDANSETRON 8 MG PO TBDP
8.0000 mg | ORAL_TABLET | Freq: Once | ORAL | Status: AC
  Administered 2010-08-19: 8 mg via ORAL
  Filled 2010-08-19: qty 1

## 2010-08-19 NOTE — ED Notes (Signed)
Pt eating breakfast. Deputy has left. NT now sitting. [name] [name])

## 2010-08-19 NOTE — ED Provider Notes (Signed)
History     Chief Complaint  Patient presents with  . Medical Clearance   Patient is a 55 y.o. female presenting with intoxication. The history is provided by the patient and the police.  Alcohol Intoxication This is a new problem. Episode onset: AN UNKNOWN TIME AGO. The problem occurs constantly. The problem has not changed since onset.The symptoms are aggravated by nothing. The symptoms are relieved by nothing.    Pt presents with police for concern for suicidality and etoh abuse.  Pt apparently drank etoh this evening, wrote a note that showed concern for depression and then called emergency help line in [place] stating she wants to hurt herself.  Police contacted and then brought here.  She denies SI.  She does admit to ETOH use.   No past medical history on file.  No past surgical history on file.  No family history on file.  History  Substance Use Topics  . Smoking status: Not on file  . Smokeless tobacco: Not on file  . Alcohol Use: Not on file    OB History    Grav Para Term Preterm Abortions TAB SAB Ect Mult Living                  Review of Systems  All other systems reviewed and are negative.    Physical Exam  BP 148/90  Pulse 113  Temp(Src) 97.7 F (36.5 C) (Oral)  Resp 18  SpO2 98%  Physical Exam  CONSTITUTIONAL: Well developed/well nourished HEAD AND FACE: Normocephalic/atraumatic EYES: EOMI/PERRL ENMT: Mucous membranes moist NECK: supple no meningeal signs CV: S1/S2 noted, no murmurs/rubs/gallops noted LUNGS: Lungs are clear to auscultation bilaterally, no apparent distress ABDOMEN: soft, nontender, no rebound or guarding NEURO: Pt is awake/alert, moves all extremitiesx4, she appears intoxicated EXTREMITIES: pulses normal, full ROM SKIN: warm, color normal PSYCH: anxious  ED Course  Procedures  MDM Nursing notes reviewed and considered in documentation All labs/vitals reviewed and considered Pt already seen by [facility] however too  intoxicated to get full assessment  Pt stable at this time Plan is to consult telepsych for recs       Joya Gaskins, MD 08/19/10 [phone]

## 2010-08-19 NOTE — ED Notes (Signed)
Pt arrived with emergency IVC papers, it is reported pt called  [facility] [place] tonight and reported ? She had harmed herself by "cuttung" her wrists and drinking heavily

## 2010-08-19 NOTE — ED Notes (Signed)
Pt talking to sitter. NAD at this time.

## 2010-08-19 NOTE — ED Provider Notes (Addendum)
History     Chief Complaint  Patient presents with  . Medical Clearance   HPI  No past medical history on file.  No past surgical history on file.  No family history on file.  History  Substance Use Topics  . Smoking status: Not on file  . Smokeless tobacco: Not on file  . Alcohol Use: Not on file    OB History    Grav Para Term Preterm Abortions TAB SAB Ect Mult Living                  Review of Systems  Physical Exam  BP 105/69  Pulse 93  Temp(Src) 97.7 F (36.5 C) (Oral)  Resp 16  SpO2 100%  Physical Exam  ED Course  Procedures Pt seen by tele pysc and it was felt the pt could be discharged home MDM Pt with etoh abuse     Benny Lennert, MD 08/19/10 1039  Benny Lennert, MD 08/19/10 1041

## 2010-08-19 NOTE — ED Notes (Signed)
Pt has had telepsych consult. Pt will now be going on per psychiatrists order which will be faxed to [place]. Pt has been calm and cooperative. Denies SI/HI.

## 2010-08-31 ENCOUNTER — Ambulatory Visit: Payer: Medicare Other | Admitting: Urgent Care

## 2010-09-22 ENCOUNTER — Encounter: Payer: Self-pay | Admitting: Urgent Care

## 2010-09-22 ENCOUNTER — Ambulatory Visit (INDEPENDENT_AMBULATORY_CARE_PROVIDER_SITE_OTHER): Payer: Medicare Other | Admitting: Urgent Care

## 2010-09-22 DIAGNOSIS — K921 Melena: Secondary | ICD-10-CM

## 2010-09-22 DIAGNOSIS — R159 Full incontinence of feces: Secondary | ICD-10-CM

## 2010-09-22 NOTE — Progress Notes (Signed)
Primary Care Physician:  Colette Ribas, MD Primary Gastroenterologist:  Dr. Jena Gauss  Chief Complaint  Patient presents with  . Melena    bad smells  . Heartburn    HPI:  [name] [name] is a 55 y.o. female here for melena & seepage x over 1 yr. She tells me she has been awakening at night w/ dark tarry black stool over her bed..   Denies abd pain, nausea, vomiting.  C/o incontinence throughout the day as well.  Wt loss 25# in past 6 mo. Protonix 40mg  BID.  Rare breakthrough heartburn.  BM 1-5 per day.  Denies dysphagia or odynophagia.  Frequent belching.  After she denies any NSAID use, however upon further questioning she does admit to take an ibuprofen 3-4 per day several times per month. She denies any anorexia.  Lab Summary Latest Ref Rng 08/19/2010  Hemoglobin 12.0 - 15.0 g/dL 16.1  Hematocrit 09.6 - 46.0 % 38.4  White count 4.0 - 10.5 K/uL 4.8  Platelet count 150 - 400 K/uL 221  Sodium 135 - 145 mEq/L 130 (L)  Potassium 3.5 - 5.1 mEq/L 3.8  Calcium 8.4 - 10.5 mg/dL 8.9  Phosphorus  (None)  Creatinine 0.50 - 1.10 mg/dL 0.45  AST 0 - 37 U/L (None)  Alk Phos 39 - 117 U/L (None)  Bilirubin 0.3 - 1.2 mg/dL (None)  Glucose 70 - 99 mg/dL 88    Past Medical History  Diagnosis Date  . Rheumatoid arthritis   . Osteoarthritis   . Epilepsy   . Depression   . GERD (gastroesophageal reflux disease)     Last EGD with 18F dilation by Dr. Jena Gauss 07/29/2006  . Osteopenia   . Schatzki's ring   . Seasonal allergies   . Hemorrhoids 10/2004    Otherwise normal colonoscopy by Dr. Jena Gauss    Past Surgical History  Procedure Date  . S/p hysterectomy   . Back surgery   . Nose surgery   . Eye surgery     right  . Tubal ligation   . Appendectomy     Current Outpatient Prescriptions  Medication Sig Dispense Refill  . cephALEXin (KEFLEX) 500 MG capsule Take 500 mg by mouth 3 (three) times daily.       . clorazepate (TRANXENE) 7.5 MG tablet Take 7.5 mg by mouth 3 (three) times daily.         . cyclobenzaprine (FLEXERIL) 10 MG tablet Take 10 mg by mouth 3 (three) times daily.       Marland Kitchen FLUoxetine (PROZAC) 20 MG capsule Take 20 mg by mouth 2 (two) times daily.        Marland Kitchen HYDROcodone-acetaminophen (LORTAB) 10-500 MG per tablet Take 1 tablet by mouth every 6 (six) hours as needed. For pain      . pantoprazole (PROTONIX) 40 MG tablet Take 40 mg by mouth 2 (two) times daily.        . primidone (MYSOLINE) 250 MG tablet Take 250 mg by mouth 3 x daily with food.        . Ascorbic Acid (VITAMIN C) 100 MG tablet Take 100 mg by mouth daily.        . Ascorbic Acid (VITAMIN C) 1000 MG tablet Take 1,000 mg by mouth daily.        . Nutritional Supplements (ESTROVEN) TABS Take 1 tablet by mouth daily.        . Pseudoephedrine HCl 240 MG TB24 Take 1 tablet by mouth daily.  Allergies as of 09/22/2010 - Review Complete 09/22/2010  Allergen Reaction Noted  . Penicillins    . Sulfonamide derivatives      Family History:There is no known family history of colorectal carcinoma , liver disease, or inflammatory bowel disease.  Problem Relation Age of Onset  . Leukemia Father   . Alzheimer's disease Mother   . Coronary artery disease Sister     History   Social History  . Marital Status: Married    Spouse Name: N/A    Number of Children: N/A  . Years of Education: N/A   Occupational History  . Works at [employer] History Main Topics  . Smoking status: Former Smoker -- 0.5 packs/day    Types: Cigarettes  . Smokeless tobacco: Not on file   Comment: as a teenager  . Alcohol Use: Yes     2-4 beers per month  . Drug Use: No  . Sexually Active: Not on file  Review of Systems: Gen: See history of present illness. CV: Denies chest pain, angina, palpitations, syncope, orthopnea, PND, peripheral edema, and claudication. Resp: Denies dyspnea at rest, dyspnea with exercise, cough, sputum, wheezing, coughing up blood, and pleurisy. GI: Denies vomiting blood, jaundice. GU :  Denies urinary burning, blood in urine, urinary frequency, urinary hesitancy, nocturnal urination, and urinary incontinence. MS: She has chronic joint pain, limitation of movement and swelling with ostia and rheumatoid arthritis.  Derm: Multiple ecchymoses.  Psych: Some anxiety. Denies any depression, memory loss, suicidal ideation. Heme: Denies enlarged lymph nodes.  Physical Exam: BP 110/70  Pulse 72  Temp(Src) 97.5 F (36.4 C) (Temporal)  Ht 5\' 3"  (1.6 m)  Wt 148 lb (67.132 kg)  BMI 26.22 kg/m2 General:   Alert,  Well-developed, well-nourished, pleasant and cooperative in NAD Head:  Normocephalic and atraumatic. Eyes:  Sclera clear, no icterus.   Conjunctiva pink. Ears:  Normal auditory acuity. Nose:  No deformity, discharge, or lesions. Mouth:  No deformity or lesions. Oropharynx pink and moist Neck:  Supple; no masses or thyromegaly. Lungs:  Clear throughout to auscultation.   No wheezes, crackles, or rhonchi. No acute distress. Heart:  Regular rate and rhythm; no murmurs, clicks, rubs,  or gallops. Abdomen:  Soft, nontender and nondistended. No masses, hepatosplenomegaly or hernias noted. Normal bowel sounds, without guarding, and without rebound.   Rectal:  Deferred until time of colonoscopy.   Msk:  Changes of chronic arthritis. Poor posture.  Pulses:  Normal pulses noted. Extremities:  Clubbing, no edema  Neurologic:  Alert and  oriented x4;  grossly normal neurologically. Skin:  Intact without significant lesions or rashes. Cervical Nodes:  No significant cervical adenopathy. Psych:  Alert and cooperative. Normal mood and affect, although somewhat anxious.

## 2010-09-23 ENCOUNTER — Other Ambulatory Visit: Payer: Self-pay | Admitting: Internal Medicine

## 2010-09-23 ENCOUNTER — Encounter: Payer: Self-pay | Admitting: Urgent Care

## 2010-09-23 DIAGNOSIS — K219 Gastro-esophageal reflux disease without esophagitis: Secondary | ICD-10-CM

## 2010-09-23 DIAGNOSIS — R159 Full incontinence of feces: Secondary | ICD-10-CM | POA: Insufficient documentation

## 2010-09-23 DIAGNOSIS — K921 Melena: Secondary | ICD-10-CM | POA: Insufficient documentation

## 2010-09-23 NOTE — Assessment & Plan Note (Signed)
[name] [name] is a 55 y.o. Caucasian female with significant osteo- and rheumatoid arthritis who has been taking NSAIDs on a regular basis. She presents with fecal incontinence of dark melanotic stools for over one year. Her last hemoglobin was normal however. She is going to need EGD for further evaluation with Dr. Jena Gauss to look for source of melena including gastritis or peptic ulcer disease.

## 2010-09-23 NOTE — Assessment & Plan Note (Addendum)
She is having nocturnal fecal incontinence which is concerning for occult malignancy or neurologic process. She is going to undergo colonoscopy with Dr. Jena Gauss for further evaluation. Microscopic colitis should remain in the differential as well.  I have discussed risks & benefits which include, but are not limited to, bleeding, infection, perforation & drug reaction.  The patient agrees with this plan & written consent will be obtained.   Procedure will need to be done with deep sedation (propofol) in the OR under the direction of anesthesia services for multiple psychoactive medications.  Avoid all NSAIDs If she has any further melena or rectal bleeding she is to go straight to the emergency department Continue Protonix 40 mg twice a day.

## 2010-09-24 ENCOUNTER — Encounter: Payer: Self-pay | Admitting: Internal Medicine

## 2010-09-28 NOTE — Progress Notes (Signed)
Cc to PCP 

## 2010-10-01 ENCOUNTER — Encounter (HOSPITAL_COMMUNITY): Payer: Self-pay

## 2010-10-01 ENCOUNTER — Encounter (HOSPITAL_COMMUNITY)
Admission: RE | Admit: 2010-10-01 | Discharge: 2010-10-01 | Disposition: A | Discharge status: Home / Self Care | Payer: Medicare Other | Source: Ambulatory Visit | Attending: Internal Medicine | Admitting: Internal Medicine

## 2010-10-01 HISTORY — DX: Adverse effect of unspecified anesthetic, initial encounter: T41.45XA

## 2010-10-01 HISTORY — DX: Other specified postprocedural states: R11.2

## 2010-10-01 HISTORY — DX: Other complications of anesthesia, initial encounter: T88.59XA

## 2010-10-01 HISTORY — DX: Other specified postprocedural states: Z98.890

## 2010-10-01 LAB — CBC
HCT: 41.2 % (ref 36.0–46.0)
MCH: 32.3 pg (ref 26.0–34.0)
MCV: 94.5 fL (ref 78.0–100.0)
Platelets: 197 10*3/uL (ref 150–400)
RDW: 12.8 % (ref 11.5–15.5)

## 2010-10-01 LAB — BASIC METABOLIC PANEL
BUN: 14 mg/dL (ref 6–23)
CO2: 31 mEq/L (ref 19–32)
Calcium: 9.6 mg/dL (ref 8.4–10.5)
Chloride: 97 mEq/L (ref 96–112)
Creatinine, Ser: 0.84 mg/dL (ref 0.50–1.10)
GFR calc Af Amer: >60 mL/min (ref >60)

## 2010-10-01 NOTE — Patient Instructions (Addendum)
20 [name] [name]  10/01/2010   Your procedure is scheduled on:  10/08/2010  Report to [facility] at  955  AM.  Call this number if you have problems the morning of surgery: [phone]   Remember:   Do not eat food:After Midnight.  Do not drink clear liquids: After Midnight.  Take these medicines the morning of surgery with A SIP OF WATER: protonix,prozac,lortab if needed   Do not wear jewelry, make-up or nail polish.  Do not wear lotions, powders, or perfumes. You may wear deodorant.  Do not shave 48 hours prior to surgery.  Do not bring valuables to the hospital.  Contacts, dentures or bridgework may not be worn into surgery.  Leave suitcase in the car. After surgery it may be brought to your room.  For patients admitted to the hospital, checkout time is 11:00 AM the day of discharge.   Patients discharged the day of surgery will not be allowed to drive home.  Name and phone number of your driver: family  Special Instructions: N/A   Please read over the following fact sheets that you were given: Pain Booklet, Surgical Site Infection Prevention, Anesthesia Post-op Instructions and Care and Recovery After Surgery PATIENT INSTRUCTIONS POST-ANESTHESIA  IMMEDIATELY FOLLOWING SURGERY:  Do not drive or operate machinery for the first twenty four hours after surgery.  Do not make any important decisions for twenty four hours after surgery or while taking narcotic pain medications or sedatives.  If you develop intractable nausea and vomiting or a severe headache please notify your doctor immediately.  FOLLOW-UP:  Please make an appointment with your surgeon as instructed. You do not need to follow up with anesthesia unless specifically instructed to do so.  WOUND CARE INSTRUCTIONS (if applicable):  Keep a dry clean dressing on the anesthesia/puncture wound site if there is drainage.  Once the wound has quit draining you may leave it open to air.  Generally you should leave the bandage intact  for twenty four hours unless there is drainage.  If the epidural site drains for more than 36-48 hours please call the anesthesia department.  QUESTIONS?:  Please feel free to call your physician or the hospital operator if you have any questions, and they will be happy to assist you.     [facility] Anesthesia Department [address] [place] [phone]

## 2010-10-06 NOTE — Progress Notes (Signed)
Results Cc to PCP  

## 2010-10-08 ENCOUNTER — Encounter (HOSPITAL_COMMUNITY)
Admission: RE | Disposition: A | Discharge status: Home / Self Care | Payer: Self-pay | Source: Ambulatory Visit | Attending: Internal Medicine

## 2010-10-08 ENCOUNTER — Encounter: Payer: Self-pay | Admitting: Internal Medicine

## 2010-10-08 ENCOUNTER — Encounter (HOSPITAL_COMMUNITY): Payer: Self-pay | Admitting: Anesthesiology

## 2010-10-08 ENCOUNTER — Ambulatory Visit (HOSPITAL_COMMUNITY): Payer: Medicare Other | Admitting: Anesthesiology

## 2010-10-08 ENCOUNTER — Encounter (HOSPITAL_COMMUNITY): Payer: Self-pay | Admitting: *Deleted

## 2010-10-08 ENCOUNTER — Ambulatory Visit (HOSPITAL_COMMUNITY)
Admission: RE | Admit: 2010-10-08 | Discharge: 2010-10-08 | Disposition: A | Discharge status: Home / Self Care | Payer: Medicare Other | Source: Ambulatory Visit | Attending: Internal Medicine | Admitting: Internal Medicine

## 2010-10-08 DIAGNOSIS — R198 Other specified symptoms and signs involving the digestive system and abdomen: Secondary | ICD-10-CM | POA: Insufficient documentation

## 2010-10-08 DIAGNOSIS — Z01812 Encounter for preprocedural laboratory examination: Secondary | ICD-10-CM | POA: Insufficient documentation

## 2010-10-08 DIAGNOSIS — K449 Diaphragmatic hernia without obstruction or gangrene: Secondary | ICD-10-CM

## 2010-10-08 DIAGNOSIS — K5730 Diverticulosis of large intestine without perforation or abscess without bleeding: Secondary | ICD-10-CM

## 2010-10-08 DIAGNOSIS — K921 Melena: Secondary | ICD-10-CM | POA: Insufficient documentation

## 2010-10-08 HISTORY — PX: ESOPHAGOGASTRODUODENOSCOPY: SHX1529

## 2010-10-08 HISTORY — PX: COLONOSCOPY: SHX174

## 2010-10-08 SURGERY — ESOPHAGOGASTRODUODENOSCOPY (EGD) WITH PROPOFOL
Anesthesia: Monitor Anesthesia Care

## 2010-10-08 MED ORDER — PROPOFOL 10 MG/ML IV EMUL
INTRAVENOUS | Status: DC | PRN
  Administered 2010-10-08: 20 mg via INTRAVENOUS
  Administered 2010-10-08: 50 mg via INTRAVENOUS
  Administered 2010-10-08: 20 mg via INTRAVENOUS

## 2010-10-08 MED ORDER — BUTAMBEN-TETRACAINE-BENZOCAINE 2-2-14 % EX AERO
1.0000 | INHALATION_SPRAY | Freq: Once | CUTANEOUS | Status: AC
  Administered 2010-10-08: 1 via TOPICAL
  Filled 2010-10-08: qty 56

## 2010-10-08 MED ORDER — ONDANSETRON HCL 4 MG/2ML IJ SOLN: 4.0000 mg | Freq: Once | INTRAMUSCULAR | Status: DC | PRN

## 2010-10-08 MED ORDER — GLYCOPYRROLATE 0.2 MG/ML IJ SOLN
0.2000 mg | Freq: Once | INTRAMUSCULAR | Status: AC
  Administered 2010-10-08: 0.2 mg via INTRAVENOUS

## 2010-10-08 MED ORDER — MIDAZOLAM HCL 5 MG/5ML IJ SOLN
INTRAMUSCULAR | Status: DC | PRN
  Administered 2010-10-08: 2 mg via INTRAVENOUS

## 2010-10-08 MED ORDER — LACTATED RINGERS IV SOLN: INTRAVENOUS | Status: DC

## 2010-10-08 MED ORDER — MIDAZOLAM HCL 2 MG/2ML IJ SOLN
1.0000 mg | INTRAMUSCULAR | Status: DC | PRN
  Administered 2010-10-08 (×2): 2 mg via INTRAVENOUS

## 2010-10-08 MED ORDER — ONDANSETRON HCL 4 MG/2ML IJ SOLN
INTRAMUSCULAR | Status: AC
  Administered 2010-10-08: 4 mg via INTRAVENOUS
  Filled 2010-10-08: qty 2

## 2010-10-08 MED ORDER — FENTANYL CITRATE 0.05 MG/ML IJ SOLN
INTRAMUSCULAR | Status: DC | PRN
  Administered 2010-10-08 (×2): 50 ug via INTRAVENOUS

## 2010-10-08 MED ORDER — MIDAZOLAM HCL 2 MG/2ML IJ SOLN
INTRAMUSCULAR | Status: AC
  Filled 2010-10-08: qty 2

## 2010-10-08 MED ORDER — MIDAZOLAM HCL 2 MG/2ML IJ SOLN
INTRAMUSCULAR | Status: AC
  Administered 2010-10-08: 2 mg via INTRAVENOUS
  Filled 2010-10-08: qty 2

## 2010-10-08 MED ORDER — STERILE WATER FOR IRRIGATION IR SOLN
Status: DC | PRN
  Administered 2010-10-08: 12:00:00

## 2010-10-08 MED ORDER — FENTANYL CITRATE 0.05 MG/ML IJ SOLN: 25.0000 ug | INTRAMUSCULAR | Status: DC | PRN

## 2010-10-08 MED ORDER — ONDANSETRON HCL 4 MG/2ML IJ SOLN
4.0000 mg | Freq: Once | INTRAMUSCULAR | Status: AC
  Administered 2010-10-08: 4 mg via INTRAVENOUS

## 2010-10-08 MED ORDER — GLYCOPYRROLATE 0.2 MG/ML IJ SOLN
INTRAMUSCULAR | Status: AC
  Administered 2010-10-08: 0.2 mg via INTRAVENOUS
  Filled 2010-10-08: qty 1

## 2010-10-08 MED ORDER — FENTANYL CITRATE 0.05 MG/ML IJ SOLN
INTRAMUSCULAR | Status: AC
  Filled 2010-10-08: qty 2

## 2010-10-08 MED ORDER — LACTATED RINGERS IV SOLN
INTRAVENOUS | Status: DC
  Administered 2010-10-08: 50 mL/h via INTRAVENOUS

## 2010-10-08 MED ORDER — PROPOFOL 10 MG/ML IV EMUL
INTRAVENOUS | Status: AC
  Filled 2010-10-08: qty 20

## 2010-10-08 MED ORDER — PROPOFOL 10 MG/ML IV EMUL
INTRAVENOUS | Status: DC | PRN
  Administered 2010-10-08: 100 ug/kg/min via INTRAVENOUS

## 2010-10-08 MED ORDER — LACTATED RINGERS IV SOLN
INTRAVENOUS | Status: DC | PRN
  Administered 2010-10-08: 11:00:00 via INTRAVENOUS

## 2010-10-08 MED ORDER — WATER FOR IRRIGATION, STERILE IR SOLN
Status: DC | PRN
  Administered 2010-10-08: 1000 mL

## 2010-10-08 SURGICAL SUPPLY — 26 items
BLOCK BITE 60FR ADLT L/F BLUE (MISCELLANEOUS) ×2 IMPLANT
DEVICE CLIP HEMOSTAT 235CM (CLIP) IMPLANT
ELECT REM PT RETURN 9FT ADLT (ELECTROSURGICAL)
ELECTRODE REM PT RTRN 9FT ADLT (ELECTROSURGICAL) IMPLANT
FCP BXJMBJMB 240X2.8X (CUTTING FORCEPS)
FLOOR PAD 36X40 (MISCELLANEOUS) ×2
FORCEP RJ3 GP 1.8X160 W-NEEDLE (CUTTING FORCEPS) IMPLANT
FORCEPS BIOP RAD 4 LRG CAP 4 (CUTTING FORCEPS) IMPLANT
FORCEPS BIOP RJ4 240 W/NDL (CUTTING FORCEPS)
FORCEPS BXJMBJMB 240X2.8X (CUTTING FORCEPS) IMPLANT
INJECTOR/SNARE I SNARE (MISCELLANEOUS) IMPLANT
LUBRICANT JELLY 4.5OZ STERILE (MISCELLANEOUS) ×2 IMPLANT
MANIFOLD NEPTUNE II (INSTRUMENTS) ×2 IMPLANT
MANIFOLD NEPTUNE WASTE (CANNULA) ×2 IMPLANT
NEEDLE SCLEROTHERAPY 25GX240 (NEEDLE) IMPLANT
PAD FLOOR 36X40 (MISCELLANEOUS) ×1 IMPLANT
PROBE APC STR FIRE (PROBE) IMPLANT
PROBE INJECTION GOLD (MISCELLANEOUS)
PROBE INJECTION GOLD 7FR (MISCELLANEOUS) IMPLANT
SNARE ROTATE MED OVAL 20MM (MISCELLANEOUS) IMPLANT
SYR 50ML LL SCALE MARK (SYRINGE) ×2 IMPLANT
TRAP SPECIMEN MUCOUS 40CC (MISCELLANEOUS) IMPLANT
TUBING ENDO SMARTCAP (MISCELLANEOUS) ×2 IMPLANT
TUBING ENDO SMARTCAP PENTAX (MISCELLANEOUS) ×2 IMPLANT
TUBING IRRIGATION ENDOGATOR (MISCELLANEOUS) ×2 IMPLANT
WATER STERILE IRR 1000ML POUR (IV SOLUTION) ×4 IMPLANT

## 2010-10-08 NOTE — Anesthesia Postprocedure Evaluation (Signed)
  Anesthesia Post-op Note  Patient: [name] [name]  Procedure(s) Performed:  COLONOSCOPY WITH PROPOFOL - Colonoscopy with Propofol Sedation started at 1303; in cecum at 1314 ; total withdrawal time is 8 minutes; ESOPHAGOGASTRODUODENOSCOPY (EGD) WITH PROPOFOL - Esophagogastroduodenoscopy completed at 1259  Patient Location: PACU  Anesthesia Type: MAC  Level of Consciousness: awake, alert  and oriented  Airway and Oxygen Therapy: Patient Spontanous Breathing  Post-op Pain: none  Post-op Assessment: Post-op Vital signs reviewed, Patient's Cardiovascular Status Stable and Respiratory Function Stable  Post-op Vital Signs: stable  Complications: No apparent anesthesia complications

## 2010-10-08 NOTE — Anesthesia Preprocedure Evaluation (Addendum)
Anesthesia Evaluation  Name, MR# and DOB Patient awake  General Assessment Comment  Reviewed: Allergy & Precautions, H&P , NPO status , Patient's Chart, lab work & pertinent test results  History of Anesthesia Complications (+) PONV  Airway Mallampati: I  Neck ROM: Full    Dental  (+) Teeth Intact   Pulmonary  recent URI and Resolved    clear to auscultation  breath sounds clear to auscultation none    Cardiovascular Regular Normal    Neuro/Psych   Seizures -, Well Controlled,  (+) Depression,    GI/Hepatic/Renal       hiatal hernia, PUD,  GERD Medicated and Poorly Controlled     Endo/Other    Abdominal   Musculoskeletal  (+) Arthritis -, Rheumatoid disorders,    Hematology   Peds  Reproductive/Obstetrics    Anesthesia Other Findings             Anesthesia Physical Anesthesia Plan  ASA: III  Anesthesia Plan: MAC   Post-op Pain Management:    Induction: Intravenous  Airway Management Planned: Simple Face Mask  Additional Equipment:   Intra-op Plan:   Post-operative Plan:   Informed Consent: I have reviewed the patients History and Physical, chart, labs and discussed the procedure including the risks, benefits and alternatives for the proposed anesthesia with the patient or authorized representative who has indicated his/her understanding and acceptance.     Plan Discussed with:   Anesthesia Plan Comments: (Anesthesia ready at 1240.  Mabeline Caras, CRNA)       Anesthesia Quick Evaluation

## 2010-10-08 NOTE — Transfer of Care (Signed)
Immediate Anesthesia Transfer of Care Note  Patient: [name] [name]  Procedure(s) Performed:  COLONOSCOPY WITH PROPOFOL - Colonoscopy with Propofol Sedation started at 1303; in cecum at 1314 ; total withdrawal time is 8 minutes; ESOPHAGOGASTRODUODENOSCOPY (EGD) WITH PROPOFOL - Esophagogastroduodenoscopy completed at 1259  Patient Location: PACU  Anesthesia Type: MAC  Level of Consciousness: awake, alert  and oriented  Airway & Oxygen Therapy: Patient Spontanous Breathing  Post-op Assessment: Report given to PACU RN  Post vital signs: stable  Complications: No apparent anesthesia complications

## 2010-10-08 NOTE — H&P (Signed)
Lorenza Burton, NP  09/23/2010  4:02 PM  Signed Primary Care Physician:  Colette Ribas, MD Primary Gastroenterologist:  Dr. Jena Gauss    Chief Complaint   Patient presents with   .  Melena       bad smells   .  Heartburn      HPI:  [name] [name] is a 55 y.o. female here for melena & seepage x over 1 yr. She tells me she has been awakening at night w/ dark tarry black stool over her bed..   Denies abd pain, nausea, vomiting.  C/o incontinence throughout the day as well.  Wt loss 25# in past 6 mo. Protonix 40mg  BID.  Rare breakthrough heartburn.  BM 1-5 per day.  Denies dysphagia or odynophagia.  Frequent belching.  After she denies any NSAID use, however upon further questioning she does admit to take an ibuprofen 3-4 per day several times per month. She denies any anorexia.    Lab Summary  Latest Ref Rng  08/19/2010   Hemoglobin  12.0 - 15.0 g/dL  16.1   Hematocrit  09.6 - 46.0 %  38.4   White count  4.0 - 10.5 K/uL  4.8   Platelet count  150 - 400 K/uL  221   Sodium  135 - 145 mEq/L  130 (L)   Potassium  3.5 - 5.1 mEq/L  3.8   Calcium  8.4 - 10.5 mg/dL  8.9   Phosphorus    (None)   Creatinine  0.50 - 1.10 mg/dL  0.45   AST  0 - 37 U/L  (None)   Alk Phos  39 - 117 U/L  (None)   Bilirubin  0.3 - 1.2 mg/dL  (None)   Glucose  70 - 99 mg/dL  88         Past Medical History   Diagnosis  Date   .  Rheumatoid arthritis     .  Osteoarthritis     .  Epilepsy     .  Depression     .  GERD (gastroesophageal reflux disease)         Last EGD with 8F dilation by Dr. Jena Gauss 07/29/2006   .  Osteopenia     .  Schatzki's ring     .  Seasonal allergies     .  Hemorrhoids  10/2004       Otherwise normal colonoscopy by Dr. Jena Gauss       Past Surgical History   Procedure  Date   .  S/p hysterectomy     .  Back surgery     .  Nose surgery     .  Eye surgery         right   .  Tubal ligation     .  Appendectomy         Current Outpatient Prescriptions   Medication  Sig  Dispense   Refill   .  cephALEXin (KEFLEX) 500 MG capsule  Take 500 mg by mouth 3 (three) times daily.          .  clorazepate (TRANXENE) 7.5 MG tablet  Take 7.5 mg by mouth 3 (three) times daily.           .  cyclobenzaprine (FLEXERIL) 10 MG tablet  Take 10 mg by mouth 3 (three) times daily.          Marland Kitchen  FLUoxetine (PROZAC) 20 MG capsule  Take 20 mg by mouth 2 (two)  times daily.           Marland Kitchen  HYDROcodone-acetaminophen (LORTAB) 10-500 MG per tablet  Take 1 tablet by mouth every 6 (six) hours as needed. For pain         .  pantoprazole (PROTONIX) 40 MG tablet  Take 40 mg by mouth 2 (two) times daily.           .  primidone (MYSOLINE) 250 MG tablet  Take 250 mg by mouth 3 x daily with food.           .  Ascorbic Acid (VITAMIN C) 100 MG tablet  Take 100 mg by mouth daily.           .  Ascorbic Acid (VITAMIN C) 1000 MG tablet  Take 1,000 mg by mouth daily.           .  Nutritional Supplements (ESTROVEN) TABS  Take 1 tablet by mouth daily.           .  Pseudoephedrine HCl 240 MG TB24  Take 1 tablet by mouth daily.               Allergies as of 09/22/2010 - Review Complete 09/22/2010   Allergen  Reaction  Noted   .  Penicillins       .  Sulfonamide derivatives           Family History:There is no known family history of colorectal carcinoma , liver disease, or inflammatory bowel disease.   Problem  Relation  Age of Onset   .  Leukemia  Father     .  Alzheimer's disease  Mother     .  Coronary artery disease  Sister           History       Social History   .  Marital Status:  Married       Spouse Name:  N/A       Number of Children:  N/A   .  Years of Education:  N/A       Occupational History   .  Works at [employer] History Main Topics   .  Smoking status:  Former Smoker -- 0.5 packs/day       Types:  Cigarettes   .  Smokeless tobacco:  Not on file     Comment: as a teenager   .  Alcohol Use:  Yes         2-4 beers per month   .  Drug Use:  No   .  Sexually Active:   Not on file    Review of Systems: Gen: See history of present illness. CV: Denies chest pain, angina, palpitations, syncope, orthopnea, PND, peripheral edema, and claudication. Resp: Denies dyspnea at rest, dyspnea with exercise, cough, sputum, wheezing, coughing up blood, and pleurisy. GI: Denies vomiting blood, jaundice. GU : Denies urinary burning, blood in urine, urinary frequency, urinary hesitancy, nocturnal urination, and urinary incontinence. MS: She has chronic joint pain, limitation of movement and swelling with ostia and rheumatoid arthritis.   Derm: Multiple ecchymoses.   Psych: Some anxiety. Denies any depression, memory loss, suicidal ideation. Heme: Denies enlarged lymph nodes.   Physical Exam: BP 110/70  Pulse 72  Temp(Src) 97.5 F (36.4 C) (Temporal)  Ht 5\' 3"  (1.6 m)  Wt 148 lb (67.132 kg)  BMI 26.22 kg/m2 General:   Alert,  Well-developed, well-nourished, pleasant and cooperative  in NAD Head:  Normocephalic and atraumatic. Eyes:  Sclera clear, no icterus.   Conjunctiva pink. Ears:  Normal auditory acuity. Nose:  No deformity, discharge, or lesions. Mouth:  No deformity or lesions. Oropharynx pink and moist Neck:  Supple; no masses or thyromegaly. Lungs:  Clear throughout to auscultation.   No wheezes, crackles, or rhonchi. No acute distress. Heart:  Regular rate and rhythm; no murmurs, clicks, rubs,  or gallops. Abdomen:  Soft, nontender and nondistended. No masses, hepatosplenomegaly or hernias noted. Normal bowel sounds, without guarding, and without rebound.    Rectal:  Deferred until time of colonoscopy.    Msk:  Changes of chronic arthritis. Poor posture.   Pulses:  Normal pulses noted. Extremities:  Clubbing, no edema   Neurologic:  Alert and  oriented x4;  grossly normal neurologically. Skin:  Intact without significant lesions or rashes. Cervical Nodes:  No significant cervical adenopathy. Psych:  Alert and cooperative. Normal mood and affect,  although somewhat anxious.  [place] Score  09/28/2010 11:03 AM  Signed Cc to PCP        Melena - Lorenza Burton, NP  09/23/2010  3:59 PM  Signed [name] [name] is a 55 y.o. Caucasian female with significant osteo- and rheumatoid arthritis who has been taking NSAIDs on a regular basis. She presents with fecal incontinence of dark melanotic stools for over one year. Her last hemoglobin was normal however. She is going to need EGD for further evaluation with Dr. Jena Gauss to look for source of melena including gastritis or peptic ulcer disease.  Incontinence of feces Yetta Barre, Tommy Rainwater, NP  09/23/2010  4:01 PM  Addendum She is having nocturnal fecal incontinence which is concerning for occult malignancy or neurologic process. She is going to undergo colonoscopy with Dr. Jena Gauss for further evaluation. Microscopic colitis should remain in the differential as well.   I have discussed risks & benefits which include, but are not limited to, bleeding, infection, perforation & drug reaction.  The patient agrees with this plan & written consent will be obtained.   Procedure will need to be done with deep sedation (propofol) in the OR under the direction of anesthesia services for multiple psychoactive medications.   Avoid all NSAIDs If she has any further melena or rectal bleeding she is to go straight to the emergency department Continue Protonix 40 mg twice a day.       I have seen the patient prior to the procedure(s) today and reviewed the history and physical / consultation from 09/23/10.  There have been no changes. After consideration of the risks, benefits, alternatives and imponderables, the patient has consented to the procedure(s).

## 2010-10-09 MED FILL — Water For Irrigation, Sterile Irrigation Soln: Qty: 1000 | Status: AC

## 2010-11-19 ENCOUNTER — Telehealth: Payer: Self-pay | Admitting: Urgent Care

## 2010-11-19 ENCOUNTER — Ambulatory Visit: Payer: Medicare Other | Admitting: Urgent Care

## 2010-11-19 NOTE — Telephone Encounter (Signed)
Please call to reschedule. Thanks

## 2010-11-23 NOTE — Telephone Encounter (Signed)
LMOM for pt to call me to [facility] her OV

## 2010-11-24 ENCOUNTER — Other Ambulatory Visit: Payer: Self-pay | Admitting: Internal Medicine

## 2010-11-24 ENCOUNTER — Telehealth: Payer: Self-pay | Admitting: Internal Medicine

## 2010-11-24 DIAGNOSIS — K921 Melena: Secondary | ICD-10-CM

## 2010-11-24 NOTE — Telephone Encounter (Signed)
Pt returned my call from yesterday to [facility] her OV and she also had questions about her results. Please return her call at [phone]

## 2010-11-24 NOTE — Telephone Encounter (Signed)
Spoke with pt- she thought she would have bx results. Informed her that no bx were done and went over RMR's discharge note from procedure. Pt has appt on 12/01/10, advised her we needed H&H prior to OV. Pt stated she would go to lab and have it done. Lab order faxed to lab.

## 2010-11-25 NOTE — Telephone Encounter (Signed)
Pt aware of OV on 10/31 at 330 with LSL

## 2010-12-02 ENCOUNTER — Ambulatory Visit: Payer: Medicare Other | Admitting: Gastroenterology

## 2010-12-02 ENCOUNTER — Telehealth: Payer: Self-pay | Admitting: Gastroenterology

## 2010-12-02 NOTE — Telephone Encounter (Signed)
Pt was a no show

## 2010-12-03 NOTE — Telephone Encounter (Signed)
No show X 2 

## 2010-12-16 ENCOUNTER — Ambulatory Visit: Payer: Medicare Other | Admitting: Gastroenterology

## 2011-02-08 ENCOUNTER — Telehealth: Payer: Self-pay | Admitting: Internal Medicine

## 2011-02-08 NOTE — Telephone Encounter (Signed)
Saw patient's husband at hospital today . She complained that she had not heard anything about her colonoscopy results. I reviewed her record and determined that she had 2 no show appointments recently. She tells me that her dog gets up on her answering machine and erases her messages - I personally made her an appointment to see [place] back in the office on January 16 at 3 PM. I gave her a sheet of paper with this information on it.

## 2011-02-17 ENCOUNTER — Ambulatory Visit: Payer: Medicare Other | Admitting: Gastroenterology

## 2011-03-01 ENCOUNTER — Ambulatory Visit: Payer: Medicare Other | Admitting: Gastroenterology

## 2011-03-04 ENCOUNTER — Ambulatory Visit: Payer: Medicare Other | Admitting: Gastroenterology

## 2011-05-17 ENCOUNTER — Ambulatory Visit: Payer: Medicare Other | Admitting: Family Medicine

## 2011-05-18 ENCOUNTER — Encounter: Payer: Self-pay | Admitting: Family Medicine

## 2011-05-20 ENCOUNTER — Ambulatory Visit: Payer: Medicare Other | Admitting: Gastroenterology

## 2011-07-15 ENCOUNTER — Inpatient Hospital Stay (HOSPITAL_COMMUNITY)
Admission: EM | Admit: 2011-07-15 | Discharge: 2011-07-17 | DRG: 379 | Disposition: A | Discharge status: Home / Self Care | Payer: Medicare Other | Attending: Internal Medicine | Admitting: Internal Medicine

## 2011-07-15 ENCOUNTER — Encounter (HOSPITAL_COMMUNITY): Payer: Self-pay

## 2011-07-15 ENCOUNTER — Emergency Department (HOSPITAL_COMMUNITY): Payer: Medicare Other

## 2011-07-15 DIAGNOSIS — R110 Nausea: Secondary | ICD-10-CM

## 2011-07-15 DIAGNOSIS — F329 Major depressive disorder, single episode, unspecified: Secondary | ICD-10-CM

## 2011-07-15 DIAGNOSIS — Z882 Allergy status to sulfonamides status: Secondary | ICD-10-CM

## 2011-07-15 DIAGNOSIS — S91159A Open bite of unspecified toe(s) without damage to nail, initial encounter: Secondary | ICD-10-CM

## 2011-07-15 DIAGNOSIS — R1110 Vomiting, unspecified: Secondary | ICD-10-CM

## 2011-07-15 DIAGNOSIS — F411 Generalized anxiety disorder: Secondary | ICD-10-CM | POA: Diagnosis present

## 2011-07-15 DIAGNOSIS — F1310 Sedative, hypnotic or anxiolytic abuse, uncomplicated: Secondary | ICD-10-CM | POA: Diagnosis present

## 2011-07-15 DIAGNOSIS — K449 Diaphragmatic hernia without obstruction or gangrene: Secondary | ICD-10-CM

## 2011-07-15 DIAGNOSIS — Z880 Allergy status to penicillin: Secondary | ICD-10-CM

## 2011-07-15 DIAGNOSIS — F1410 Cocaine abuse, uncomplicated: Secondary | ICD-10-CM | POA: Diagnosis present

## 2011-07-15 DIAGNOSIS — Z87891 Personal history of nicotine dependence: Secondary | ICD-10-CM

## 2011-07-15 DIAGNOSIS — M1990 Unspecified osteoarthritis, unspecified site: Secondary | ICD-10-CM | POA: Diagnosis present

## 2011-07-15 DIAGNOSIS — S90129A Contusion of unspecified lesser toe(s) without damage to nail, initial encounter: Secondary | ICD-10-CM | POA: Diagnosis present

## 2011-07-15 DIAGNOSIS — Z23 Encounter for immunization: Secondary | ICD-10-CM

## 2011-07-15 DIAGNOSIS — K922 Gastrointestinal hemorrhage, unspecified: Secondary | ICD-10-CM

## 2011-07-15 DIAGNOSIS — K5790 Diverticulosis of intestine, part unspecified, without perforation or abscess without bleeding: Secondary | ICD-10-CM | POA: Diagnosis present

## 2011-07-15 DIAGNOSIS — K219 Gastro-esophageal reflux disease without esophagitis: Secondary | ICD-10-CM | POA: Diagnosis present

## 2011-07-15 DIAGNOSIS — Z79899 Other long term (current) drug therapy: Secondary | ICD-10-CM

## 2011-07-15 DIAGNOSIS — J301 Allergic rhinitis due to pollen: Secondary | ICD-10-CM

## 2011-07-15 DIAGNOSIS — R109 Unspecified abdominal pain: Secondary | ICD-10-CM

## 2011-07-15 DIAGNOSIS — K319 Disease of stomach and duodenum, unspecified: Secondary | ICD-10-CM

## 2011-07-15 DIAGNOSIS — K5731 Diverticulosis of large intestine without perforation or abscess with bleeding: Principal | ICD-10-CM | POA: Diagnosis present

## 2011-07-15 DIAGNOSIS — B1920 Unspecified viral hepatitis C without hepatic coma: Secondary | ICD-10-CM | POA: Diagnosis present

## 2011-07-15 DIAGNOSIS — K921 Melena: Secondary | ICD-10-CM

## 2011-07-15 DIAGNOSIS — W540XXA Bitten by dog, initial encounter: Secondary | ICD-10-CM | POA: Diagnosis present

## 2011-07-15 DIAGNOSIS — M069 Rheumatoid arthritis, unspecified: Secondary | ICD-10-CM | POA: Diagnosis present

## 2011-07-15 DIAGNOSIS — M899 Disorder of bone, unspecified: Secondary | ICD-10-CM

## 2011-07-15 DIAGNOSIS — F3289 Other specified depressive episodes: Secondary | ICD-10-CM | POA: Diagnosis present

## 2011-07-15 HISTORY — DX: Full incontinence of feces: R15.9

## 2011-07-15 HISTORY — DX: Other psychoactive substance abuse, uncomplicated: F19.10

## 2011-07-15 HISTORY — DX: Diaphragmatic hernia without obstruction or gangrene: K44.9

## 2011-07-15 HISTORY — DX: Diverticulosis of intestine, part unspecified, without perforation or abscess without bleeding: K57.90

## 2011-07-15 LAB — COMPREHENSIVE METABOLIC PANEL
AST: 162 U/L — ABNORMAL HIGH (ref 0–37)
Albumin: 4.2 g/dL (ref 3.5–5.2)
BUN: 18 mg/dL (ref 6–23)
Calcium: 9.6 mg/dL (ref 8.4–10.5)
Creatinine, Ser: 1.05 mg/dL (ref 0.50–1.10)
Total Bilirubin: 0.5 mg/dL (ref 0.3–1.2)
Total Protein: 8.3 g/dL (ref 6.0–8.3)

## 2011-07-15 LAB — CBC
HCT: 38.8 % (ref 36.0–46.0)
Hemoglobin: 13.5 g/dL (ref 12.0–15.0)
MCH: 32.3 pg (ref 26.0–34.0)
MCHC: 34.8 g/dL (ref 30.0–36.0)
RDW: 12.5 % (ref 11.5–15.5)

## 2011-07-15 LAB — URINALYSIS, ROUTINE W REFLEX MICROSCOPIC
Glucose, UA: NEGATIVE mg/dL
Leukocytes, UA: NEGATIVE
Protein, ur: NEGATIVE mg/dL
Specific Gravity, Urine: <1.005 — ABNORMAL LOW (ref 1.005–1.030)

## 2011-07-15 LAB — DIFFERENTIAL
Basophils Absolute: 0 10*3/uL (ref 0.0–0.1)
Basophils Relative: 0 % (ref 0–1)
Eosinophils Absolute: 0.1 10*3/uL (ref 0.0–0.7)
Eosinophils Relative: 1 % (ref 0–5)
Monocytes Absolute: 0.5 10*3/uL (ref 0.1–1.0)
Monocytes Relative: 12 % (ref 3–12)

## 2011-07-15 LAB — OCCULT BLOOD, POC DEVICE: Fecal Occult Bld: NEGATIVE

## 2011-07-15 LAB — PROTIME-INR: INR: 1.02 (ref 0.00–1.49)

## 2011-07-15 LAB — URINE MICROSCOPIC-ADD ON

## 2011-07-15 LAB — HEMOGLOBIN AND HEMATOCRIT, BLOOD: HCT: 34.8 % — ABNORMAL LOW (ref 36.0–46.0)

## 2011-07-15 LAB — LIPASE, BLOOD: Lipase: 42 U/L (ref 11–59)

## 2011-07-15 LAB — CLOSTRIDIUM DIFFICILE BY PCR: Toxigenic C. Difficile by PCR: NEGATIVE

## 2011-07-15 MED ORDER — SODIUM CHLORIDE 0.9 % IV SOLN
INTRAVENOUS | Status: DC
  Administered 2011-07-15: 19:00:00 via INTRAVENOUS

## 2011-07-15 MED ORDER — PANTOPRAZOLE SODIUM 40 MG IV SOLR
40.0000 mg | Freq: Once | INTRAVENOUS | Status: AC
  Administered 2011-07-15: 40 mg via INTRAVENOUS
  Filled 2011-07-15: qty 40

## 2011-07-15 MED ORDER — IOHEXOL 300 MG/ML  SOLN
100.0000 mL | Freq: Once | INTRAMUSCULAR | Status: AC | PRN
  Administered 2011-07-15: 100 mL via INTRAVENOUS

## 2011-07-15 MED ORDER — MORPHINE SULFATE 4 MG/ML IJ SOLN
4.0000 mg | INTRAMUSCULAR | Status: AC | PRN
  Administered 2011-07-15 (×2): 4 mg via INTRAVENOUS
  Filled 2011-07-15 (×2): qty 1

## 2011-07-15 MED ORDER — ONDANSETRON HCL 4 MG/2ML IJ SOLN
4.0000 mg | INTRAMUSCULAR | Status: AC | PRN
  Administered 2011-07-15 – 2011-07-16 (×2): 4 mg via INTRAVENOUS
  Filled 2011-07-15 (×2): qty 2

## 2011-07-15 MED ORDER — FAMOTIDINE IN NACL 20-0.9 MG/50ML-% IV SOLN
20.0000 mg | Freq: Once | INTRAVENOUS | Status: AC
  Administered 2011-07-15: 20 mg via INTRAVENOUS
  Filled 2011-07-15: qty 50

## 2011-07-15 NOTE — ED Notes (Signed)
Pt anxious, breathing fast.  Denies pain or nausea but is belching a lot.  PT c/o feeling tingly in hands and feet.

## 2011-07-15 NOTE — ED Provider Notes (Signed)
History     CSN: [id number]  Arrival date & time 07/15/11  1807   First MD Initiated Contact with Patient 07/15/11 1814      Chief Complaint  Patient presents with  . Abdominal Pain     HPI Pt was seen at 1830.  Per pt, c/o gradual onset and persistence of constantly "feeling hot" while sitting in a store, followed by lower abd "pain" and diarrhea.  Describes the abd pain as all over, but esp LLQ, and "cramping."  States she started to "breathe fast" and felt her hands, feet and then "all over" "tingling."  On arrival to scene, EMS noted pt hyperventilating and diaphoretic with CBG 85, "SBP 84," with IVF NS bolus given with SBP increasing to "102."  Pt states she "feels better" now.  Pt describes her diarrhea as "watery" and "has blood in it."  Denies N/V, no back pain, no fevers, no rash, no CP/SOB, no cough.   GI:  [facility] Past Medical History  Diagnosis Date  . Rheumatoid arthritis   . Osteoarthritis   . Epilepsy   . Depression   . GERD (gastroesophageal reflux disease)     Last EGD with 65F dilation by Dr. Jena Gauss 07/29/2006  . Osteopenia   . Schatzki's ring   . Seasonal allergies   . Hemorrhoids 10/2004    Otherwise normal colonoscopy by Dr. Jena Gauss  . Complication of anesthesia   . PONV (postoperative nausea and vomiting)   . Hepatitis B   . Hepatitis C   . Diverticulosis   . Hiatal hernia   . Melena   . Fecal incontinence   . Belching     Past Surgical History  Procedure Date  . S/p hysterectomy   . Back surgery   . Nose surgery   . Eye surgery     right  . Tubal ligation   . Appendectomy   . Colonoscopy 10/08/10    pancolonic diverticulosis otherwise normal colon and rectum  . Esophagogastroduodenoscopy 10/08/10    small hiatal hernia otherwise normal  . Rectocele repair     Family History  Problem Relation Age of Onset  . Leukemia Father   . Alzheimer's disease Mother   . Coronary artery disease Sister   . Anesthesia problems Neg Hx   . Hypotension  Neg Hx   . Malignant hyperthermia Neg Hx   . Pseudochol deficiency Neg Hx     History  Substance Use Topics  . Smoking status: Former Smoker -- 0.5 packs/day    Types: Cigarettes  . Smokeless tobacco: Not on file   Comment: as a teenager  . Alcohol Use: Yes     2-4 beers per month    Review of Systems ROS: Statement: All systems negative except as marked or noted in the HPI; Constitutional: Negative for fever and chills. ; ; Eyes: Negative for eye pain, redness and discharge. ; ; ENMT: Negative for ear pain, hoarseness, nasal congestion, sinus pressure and sore throat. ; ; Cardiovascular: Negative for chest pain, palpitations, diaphoresis, dyspnea and peripheral edema. ; ; Respiratory: Negative for cough, wheezing and stridor. ; ; Gastrointestinal: Negative for nausea, vomiting, rectal bleeding, hematemesis, jaundice and +diarrhea, abdominal pain, blood in stool. ; ; Genitourinary: Negative for dysuria, flank pain and hematuria. ; ; Musculoskeletal: Negative for back pain and neck pain. Negative for swelling and trauma.; ; Skin: Negative for pruritus, rash, abrasions, blisters, bruising and skin lesion.; ; Neuro: +paresthesias.  Negative for headache, lightheadedness and neck stiffness.  Negative for weakness, altered level of consciousness , altered mental status, extremity weakness, involuntary movement, seizure and syncope.     Allergies  Penicillins and Sulfonamide derivatives  Home Medications   Current Outpatient Rx  Name Route Sig Dispense Refill  . VITAMIN C 1000 MG PO TABS Oral Take 1,000 mg by mouth daily.      . IBUPROFEN 200 MG PO TABS Oral Take 600 mg by mouth 3 (three) times daily as needed. For arthritis pain    . ESTROVEN PO TABS Oral Take 1 tablet by mouth every evening.     Marland Kitchen PANTOPRAZOLE SODIUM 40 MG PO TBEC Oral Take 40 mg by mouth 2 (two) times daily.      Marland Kitchen PRIMIDONE 250 MG PO TABS Oral Take 250 mg by mouth 3 x daily with food.      Marland Kitchen PSEUDOEPHEDRINE HCL ER 240 MG  PO TB24 Oral Take 1 tablet by mouth daily.        BP 128/82  Pulse 101  Temp 97.3 F (36.3 C) (Oral)  Resp 18  SpO2 100%  Physical Exam 1835: Physical examination:  Nursing notes reviewed; Vital signs and O2 SAT reviewed;  Constitutional: Well developed, Well nourished, Well hydrated, In no acute distress; Head:  Normocephalic, atraumatic; Eyes: EOMI, PERRL, No scleral icterus; ENMT: Mouth and pharynx normal, Mucous membranes moist; Neck: Supple, Full range of motion, No lymphadenopathy; Cardiovascular: Regular rate and rhythm, No murmur, rub, or gallop; Respiratory: Breath sounds clear & equal bilaterally, No rales, rhonchi, wheezes.  Speaking full sentences with ease, +hyperventilating, will slow breathing with direction; Chest: Nontender, Movement normal; Abdomen: Soft, +diffusely tender to palp, Nondistended, Normal bowel sounds, Rectal exam performed w/permission of pt and ED RN chaparone present.  Anal tone normal.  Non-tender, soft brown stool in rectal vault, heme neg.  No fissures, no external hemorrhoids, no palp masses.;; Extremities: Pulses normal, No tenderness, No edema, No calf edema or asymmetry.; Neuro: AA&Ox3, Major CN grossly intact.  Speech clear. No gross focal motor or sensory deficits in extremities.; Skin: Color normal, Warm, Dry.   ED Course  Procedures    MDM  MDM Reviewed: nursing note, vitals and previous chart Reviewed previous: labs and ECG Interpretation: labs, ECG and CT scan      Date: 07/15/2011  Rate: 98  Rhythm: normal sinus rhythm  QRS Axis: normal  Intervals: normal  ST/T Wave abnormalities: normal  Conduction Disutrbances:none  Narrative Interpretation:   Old EKG Reviewed: unchanged; no significant changes from previous EKG dated 04/23/2009.   Results for orders placed during the hospital encounter of 07/15/11  CBC      Component Value Range   WBC 3.7 (*) 4.0 - 10.5 K/uL   RBC 4.18  3.87 - 5.11 MIL/uL   Hemoglobin 13.5  12.0 - 15.0 g/dL    HCT 91.4  78.2 - 95.6 %   MCV 92.8  78.0 - 100.0 fL   MCH 32.3  26.0 - 34.0 pg   MCHC 34.8  30.0 - 36.0 g/dL   RDW 21.3  08.6 - 57.8 %   Platelets 181  150 - 400 K/uL  DIFFERENTIAL      Component Value Range   Neutrophils Relative 56  43 - 77 %   Neutro Abs 2.1  1.7 - 7.7 K/uL   Lymphocytes Relative 30  12 - 46 %   Lymphs Abs 1.1  0.7 - 4.0 K/uL   Monocytes Relative 12  3 - 12 %  Monocytes Absolute 0.5  0.1 - 1.0 K/uL   Eosinophils Relative 1  0 - 5 %   Eosinophils Absolute 0.1  0.0 - 0.7 K/uL   Basophils Relative 0  0 - 1 %   Basophils Absolute 0.0  0.0 - 0.1 K/uL  COMPREHENSIVE METABOLIC PANEL      Component Value Range   Sodium 135  135 - 145 mEq/L   Potassium 3.7  3.5 - 5.1 mEq/L   Chloride 97  96 - 112 mEq/L   CO2 24  19 - 32 mEq/L   Glucose, Bld 132 (*) 70 - 99 mg/dL   BUN 18  6 - 23 mg/dL   Creatinine, Ser 9.81  0.50 - 1.10 mg/dL   Calcium 9.6  8.4 - 19.1 mg/dL   Total Protein 8.3  6.0 - 8.3 g/dL   Albumin 4.2  3.5 - 5.2 g/dL   AST 478 (*) 0 - 37 U/L   ALT 122 (*) 0 - 35 U/L   Alkaline Phosphatase 136 (*) 39 - 117 U/L   Total Bilirubin 0.5  0.3 - 1.2 mg/dL   GFR calc non Af Amer 58 (*) >90 mL/min   GFR calc Af Amer 67 (*) >90 mL/min  URINALYSIS, ROUTINE W REFLEX MICROSCOPIC      Component Value Range   Color, Urine YELLOW  YELLOW   APPearance CLEAR  CLEAR   Specific Gravity, Urine <1.005 (*) 1.005 - 1.030   pH 5.5  5.0 - 8.0   Glucose, UA NEGATIVE  NEGATIVE mg/dL   Hgb urine dipstick TRACE (*) NEGATIVE   Bilirubin Urine NEGATIVE  NEGATIVE   Ketones, ur NEGATIVE  NEGATIVE mg/dL   Protein, ur NEGATIVE  NEGATIVE mg/dL   Urobilinogen, UA 0.2  0.0 - 1.0 mg/dL   Nitrite NEGATIVE  NEGATIVE   Leukocytes, UA NEGATIVE  NEGATIVE  OCCULT BLOOD, POC DEVICE      Component Value Range   Fecal Occult Bld NEGATIVE    LIPASE, BLOOD      Component Value Range   Lipase 42  11 - 59 U/L  CLOSTRIDIUM DIFFICILE BY PCR      Component Value Range   C difficile by pcr  NEGATIVE  NEGATIVE  PROTIME-INR      Component Value Range   Prothrombin Time 13.6  11.6 - 15.2 seconds   INR 1.02  0.00 - 1.49  APTT      Component Value Range   aPTT 29  24 - 37 seconds  URINE MICROSCOPIC-ADD ON      Component Value Range   Squamous Epithelial / LPF MANY (*) RARE   WBC, UA 0-2  <3 WBC/hpf   RBC / HPF 0-2  <3 RBC/hpf   Bacteria, UA FEW (*) RARE  OCCULT BLOOD, POC DEVICE      Component Value Range   Fecal Occult Bld POSITIVE      Ct Abdomen Pelvis W Contrast 07/15/2011  *RADIOLOGY REPORT*  Clinical Data: Abdominal pain, diarrhea left lower quadrant pain  CT ABDOMEN AND PELVIS WITH CONTRAST  Technique:  Multidetector CT imaging of the abdomen and pelvis was performed following the standard protocol during bolus administration of intravenous contrast.  Contrast: OMNIPAQUE IOHEXOL 300 MG/ML  SOLN  Comparison: Report 10/06/2001 no images available  Findings: Sagittal images of the spine shows postsurgical changes lumbar spine at L5-S1 level.  Small hiatal hernia.  Lung bases are unremarkable.  Mild hepatic fatty infiltration.  Mild distended gallbladder.  No calcified  gallstones are noted within gallbladder.  The abdominal aorta is unremarkable.  Spleen, pancreas and adrenals are unremarkable. Kidneys are symmetrical in size and enhancement.  No focal renal mass.  Delayed renal images shows bilateral renal symmetrical excretion.  Bilateral visualized proximal ureter is unremarkable. There is mild gastric distention with debris probable recent ingested food.  No evidence of gastric outlet obstruction.  No small bowel obstruction.  No ascites or free air.  No adenopathy.  There is no pericecal inflammation.  The patient is status post appendectomy.  The urinary bladder is unremarkable.  A few sigmoid colon diverticula are noted without evidence of acute diverticulitis. The patient is status post hysterectomy.  No pelvic ascites or adenopathy.  No destructive bony lesions are noted  within pelvis.  Mild degenerative changes are noted right SI joint.  IMPRESSION:  1.  No acute inflammatory process within abdomen or pelvis. 2.  Small hiatal hernia. Moderate gastric distention without evidence of gastric outlet obstruction.  3.  Mild hepatic fatty infiltration. 4.  No hydronephrosis or hydroureter. 5.  Postsurgical changes at L5 S1 level. 6.  Status post appendectomy.  Status post hysterectomy.  Original Report Authenticated By: Natasha Mead, M.D.    Results for [name], [name] HINDS (MRN [id number]) as of 07/15/2011 23:41  Ref. Range 04/23/2009 05:53 07/15/2011 18:18  AST Latest Range: 0-37 U/L 257 (H) 162 (H)  ALT Latest Range: 0-35 U/L 224 (H) 122 (H)     2300:  CT without acute abd process.  LFT's elevated, but improved from previous.  H/H stable.  Pt has had multiple small red blood + mucus BM's while in the ED.  Stool C&S, cdiff sent.  Recheck of this stool heme test is positive.  Pt does not have hx of esophageal varices, has normal PT/PTT, H/H and plts.  VS remain stable.  She has tol PO well while in the ED.  Will re-check H/H.   0005:  Pt sitting up in chair, states she continues to have abd pain and has multiple bloody BM's per ED RN and ED Tech.  Describes her symptoms as "I get a cramp and then I have a BM."  Cdiff is negative.  H/H has dropped 1+ point over the past 5 hours.  No IVF boluses have been given in the ED.  SBP stable, HR is tachycardic.  Dx testing d/w pt and family.  Questions answered.  Verb understanding, agreeable to admit.  T/C to [facility] Dr. Orvan Falconer, case discussed, including:  HPI, pertinent PM/SHx, VS/PE, dx testing, ED course and treatment:  Agreeable to admit, requests to obtain tele bed.     Laray Anger, DO 07/16/11 1502

## 2011-07-15 NOTE — ED Notes (Signed)
Pt reports ate at [employer] and approx 30 min later sudden onset of LLQ pain and diarrhea.  PT diaphoretic.  Alert and oriented.  EMS adminsiterd nss.  CBG 85, initial bp 84/66 then increased to 102/72 after fluid bolus.  HR 96.   EMS reports pt was hyperventilating.  C/O tingling all over.

## 2011-07-15 NOTE — ED Notes (Signed)
Pt had a small bloody mucous stool

## 2011-07-16 ENCOUNTER — Encounter (HOSPITAL_COMMUNITY): Payer: Self-pay | Admitting: Internal Medicine

## 2011-07-16 DIAGNOSIS — F411 Generalized anxiety disorder: Secondary | ICD-10-CM

## 2011-07-16 DIAGNOSIS — R7989 Other specified abnormal findings of blood chemistry: Secondary | ICD-10-CM

## 2011-07-16 DIAGNOSIS — K625 Hemorrhage of anus and rectum: Secondary | ICD-10-CM

## 2011-07-16 DIAGNOSIS — K5790 Diverticulosis of intestine, part unspecified, without perforation or abscess without bleeding: Secondary | ICD-10-CM | POA: Diagnosis present

## 2011-07-16 DIAGNOSIS — K922 Gastrointestinal hemorrhage, unspecified: Secondary | ICD-10-CM | POA: Diagnosis present

## 2011-07-16 DIAGNOSIS — S91159A Open bite of unspecified toe(s) without damage to nail, initial encounter: Secondary | ICD-10-CM | POA: Diagnosis present

## 2011-07-16 DIAGNOSIS — R197 Diarrhea, unspecified: Secondary | ICD-10-CM

## 2011-07-16 DIAGNOSIS — K219 Gastro-esophageal reflux disease without esophagitis: Secondary | ICD-10-CM

## 2011-07-16 DIAGNOSIS — K5731 Diverticulosis of large intestine without perforation or abscess with bleeding: Secondary | ICD-10-CM

## 2011-07-16 DIAGNOSIS — R1032 Left lower quadrant pain: Secondary | ICD-10-CM

## 2011-07-16 LAB — CBC
HCT: 35.3 % — ABNORMAL LOW (ref 36.0–46.0)
Hemoglobin: 12.2 g/dL (ref 12.0–15.0)
MCH: 32.2 pg (ref 26.0–34.0)
MCHC: 34.6 g/dL (ref 30.0–36.0)
MCV: 93.1 fL (ref 78.0–100.0)
RDW: 12.5 % (ref 11.5–15.5)

## 2011-07-16 LAB — COMPREHENSIVE METABOLIC PANEL
Albumin: 3.7 g/dL (ref 3.5–5.2)
Alkaline Phosphatase: 118 U/L — ABNORMAL HIGH (ref 39–117)
BUN: 11 mg/dL (ref 6–23)
Creatinine, Ser: 0.86 mg/dL (ref 0.50–1.10)
GFR calc Af Amer: 85 mL/min — ABNORMAL LOW (ref >90)
Glucose, Bld: 88 mg/dL (ref 70–99)
Potassium: 3.6 mEq/L (ref 3.5–5.1)
Total Protein: 7.5 g/dL (ref 6.0–8.3)

## 2011-07-16 LAB — HEPATITIS C ANTIBODY: HCV Ab: REACTIVE — AB

## 2011-07-16 LAB — URINE CULTURE

## 2011-07-16 LAB — TSH: TSH: 2.833 u[IU]/mL (ref 0.350–4.500)

## 2011-07-16 LAB — HEPATITIS B SURFACE ANTIGEN: Hepatitis B Surface Ag: NEGATIVE

## 2011-07-16 MED ORDER — SODIUM CHLORIDE 0.9 % IV SOLN: INTRAVENOUS | Status: AC

## 2011-07-16 MED ORDER — ACETAMINOPHEN 325 MG PO TABS: 650.0000 mg | ORAL_TABLET | Freq: Four times a day (QID) | ORAL | Status: DC | PRN

## 2011-07-16 MED ORDER — BOOST HIGH PROTEIN PO LIQD
237.0000 mL | Freq: Three times a day (TID) | ORAL | Status: DC
  Administered 2011-07-17: 1 via ORAL
  Filled 2011-07-16 (×9): qty 1

## 2011-07-16 MED ORDER — POTASSIUM CHLORIDE IN NACL 20-0.9 MEQ/L-% IV SOLN
INTRAVENOUS | Status: DC
  Administered 2011-07-16 – 2011-07-17 (×2): via INTRAVENOUS

## 2011-07-16 MED ORDER — TRAZODONE HCL 50 MG PO TABS: 25.0000 mg | ORAL_TABLET | Freq: Every evening | ORAL | Status: DC | PRN

## 2011-07-16 MED ORDER — PANTOPRAZOLE SODIUM 40 MG PO TBEC
40.0000 mg | DELAYED_RELEASE_TABLET | Freq: Two times a day (BID) | ORAL | Status: DC
  Administered 2011-07-16 – 2011-07-17 (×4): 40 mg via ORAL
  Filled 2011-07-16 (×4): qty 1

## 2011-07-16 MED ORDER — ONDANSETRON HCL 4 MG/2ML IJ SOLN: 4.0000 mg | INTRAMUSCULAR | Status: DC | PRN

## 2011-07-16 MED ORDER — MORPHINE SULFATE 2 MG/ML IJ SOLN
2.0000 mg | INTRAMUSCULAR | Status: AC | PRN
  Administered 2011-07-16 (×2): 2 mg via INTRAVENOUS
  Filled 2011-07-16 (×2): qty 1

## 2011-07-16 MED ORDER — DOXYCYCLINE HYCLATE 100 MG PO TABS
100.0000 mg | ORAL_TABLET | Freq: Two times a day (BID) | ORAL | Status: DC
  Administered 2011-07-16 – 2011-07-17 (×3): 100 mg via ORAL
  Filled 2011-07-16 (×3): qty 1

## 2011-07-16 MED ORDER — PRIMIDONE 250 MG PO TABS: 250.0000 mg | ORAL_TABLET | Freq: Three times a day (TID) | ORAL | Status: DC

## 2011-07-16 MED ORDER — TETANUS-DIPHTH-ACELL PERTUSSIS 5-2.5-18.5 LF-MCG/0.5 IM SUSP
0.5000 mL | Freq: Once | INTRAMUSCULAR | Status: AC
  Administered 2011-07-16: 0.5 mL via INTRAMUSCULAR
  Filled 2011-07-16: qty 0.5

## 2011-07-16 MED ORDER — PRIMIDONE 250 MG PO TABS
250.0000 mg | ORAL_TABLET | Freq: Three times a day (TID) | ORAL | Status: DC
  Administered 2011-07-16 – 2011-07-17 (×6): 250 mg via ORAL
  Filled 2011-07-16 (×12): qty 1

## 2011-07-16 MED ORDER — ONDANSETRON HCL 4 MG/2ML IJ SOLN: 4.0000 mg | Freq: Three times a day (TID) | INTRAMUSCULAR | Status: AC | PRN

## 2011-07-16 MED ORDER — PRIMIDONE 250 MG PO TABS
ORAL_TABLET | ORAL | Status: AC
  Filled 2011-07-16: qty 1

## 2011-07-16 MED ORDER — HYDROMORPHONE HCL PF 1 MG/ML IJ SOLN: 0.5000 mg | INTRAMUSCULAR | Status: DC | PRN

## 2011-07-16 MED ORDER — OXYCODONE HCL 5 MG PO TABS
5.0000 mg | ORAL_TABLET | ORAL | Status: DC | PRN
  Administered 2011-07-16 – 2011-07-17 (×3): 5 mg via ORAL
  Filled 2011-07-16 (×3): qty 1

## 2011-07-16 MED ORDER — ACETAMINOPHEN 650 MG RE SUPP: 650.0000 mg | Freq: Four times a day (QID) | RECTAL | Status: DC | PRN

## 2011-07-16 NOTE — Consult Note (Signed)
Pt had abd cramps followed by rectal bleeding. Pain resolved. Pt most likely having a diverticular bleed. Thought she had diarrhea but she didn't look in the toilet. Sister called and said she had frank blood in the toilet. [facility] DIET. DR. Karilyn Cota WILL FOLLOW 6/15-16.

## 2011-07-16 NOTE — H&P (Signed)
PCP:   Colette Ribas, MD   Chief Complaint:  Diarrhea  HPI: [name] [name] is an 56 y.o. female.  Middle-aged Caucasian lady with a history of chronic pain and anxiety, but has no primary care physician, and has not been getting medication regularly since discharge from [facility] some years ago. Patient reportedly developed the diarrhea while in the supermarket and is rostral emergency room by ambulance with symptoms suggestive of panic attack. She became more settled in the emergency room and back to her baseline considered to be in a condition stable for discharge, other than the fact that she's had for a bloody mucousy stool while in the emergency room.  CT scan of the abdomen and pelvis done in the emergency room shows evidence of extensive colonic diverticulosis, hospitalist service has been called to assist with management.  History proves challenging because patient  frequently diverts to histrionic neurotic activity, such as serial belching.  She does have a history of diverticulosis, but denies fever nausea or vomiting.  Rewiew of Systems:  The patient denies anorexia, fever, weight loss,, vision loss, decreased hearing, hoarseness, chest pain, syncope, dyspnea on exertion, peripheral edema, balance deficits, hemoptysis, abdominal pain, melena, hematochezia, severe indigestion/heartburn, hematuria, incontinence, genital sores, muscle weakness, suspicious skin lesions, transient blindness, difficulty walking, depression, unusual weight change, abnormal bleeding, enlarged lymph nodes, angioedema, and breast masses.    Past Medical History  Diagnosis Date  . Rheumatoid arthritis   . Osteoarthritis   . Epilepsy   . Depression   . GERD (gastroesophageal reflux disease)     Last EGD with 24F dilation by Dr. Jena Gauss 07/29/2006  . Osteopenia   . Schatzki's ring   . Seasonal allergies   . Hemorrhoids 10/2004    Otherwise normal colonoscopy by Dr. Jena Gauss  . Complication of  anesthesia   . PONV (postoperative nausea and vomiting)   . Hepatitis B   . Hepatitis C   . Diverticulosis   . Hiatal hernia   . Melena   . Fecal incontinence   . Belching     Past Surgical History  Procedure Date  . S/p hysterectomy   . Back surgery   . Nose surgery   . Eye surgery     right  . Tubal ligation   . Appendectomy   . Colonoscopy 10/08/10    pancolonic diverticulosis otherwise normal colon and rectum  . Esophagogastroduodenoscopy 10/08/10    small hiatal hernia otherwise normal  . Rectocele repair     Medications:  HOME MEDS: Prior to Admission medications   Medication Sig Start Date End Date Taking? Authorizing Provider  Ascorbic Acid (VITAMIN C) 1000 MG tablet Take 1,000 mg by mouth daily.     Yes Historical Provider, MD  ibuprofen (ADVIL,MOTRIN) 200 MG tablet Take 600 mg by mouth 3 (three) times daily as needed. For arthritis pain   Yes Historical Provider, MD  Nutritional Supplements (ESTROVEN) TABS Take 1 tablet by mouth every evening.    Yes Historical Provider, MD  pantoprazole (PROTONIX) 40 MG tablet Take 40 mg by mouth 2 (two) times daily.     Yes Historical Provider, MD  primidone (MYSOLINE) 250 MG tablet Take 250 mg by mouth 3 x daily with food.     Yes Historical Provider, MD  Pseudoephedrine HCl 240 MG TB24 Take 1 tablet by mouth daily.     Yes Historical Provider, MD     Allergies:  Allergies  Allergen Reactions  . Penicillins Other (See Comments)  Turned purple with Penicillin shot when younger  . Sulfonamide Derivatives     REACTION: UNKNOWN REACTION    Social History:   reports that she has quit smoking. Her smoking use included Cigarettes. She smoked .5 packs per day. She does not have any smokeless tobacco history on file. She reports that she drinks alcohol. She reports that she does not use illicit drugs.  Family History: Family History  Problem Relation Age of Onset  . Leukemia Father   . Alzheimer's disease Mother   .  Coronary artery disease Sister   . Anesthesia problems Neg Hx   . Hypotension Neg Hx   . Malignant hyperthermia Neg Hx   . Pseudochol deficiency Neg Hx      Physical Exam: Filed Vitals:   07/15/11 1813 07/15/11 1918  BP: 118/69 128/82  Pulse: 97 101  Temp: 97.3 F (36.3 C)   TempSrc: Oral   Resp: 22 18  SpO2: 100% 100%   Blood pressure 128/82, pulse 101, temperature 97.3 F (36.3 C), temperature source Oral, resp. rate 18, SpO2 100.00%.  GEN:  Anxious middle-aged Caucasian lady person sitting her in the stretcher in no acute distress; cooperative with exam PSYCH:  alert and oriented x4; does not appear anxious or depressed; affect is appropriate. HEENT: Mucous membranes pink and anicteric; PERRLA; EOM intact; no cervical lymphadenopathy nor thyromegaly or carotid bruit; no JVD; Breasts:: Not examined CHEST WALL: No tenderness CHEST: Normal respiration, clear to auscultation bilaterally HEART: Regular rate and rhythm; no murmurs rubs or gallops BACK: ; no CVA tenderness ABDOMEN: Soft; mild generalized abdominal tenderness; no masses, no organomegaly, normal abdominal bowel sounds;  Rectal Exam: Not done; initially Hemoccult negative per the emergency room physician EXTREMITIES: age-appropriate arthropathy of the hands and knees; no edema; no ulcerations. Genitalia: not examined PULSES: 2+ and symmetric SKIN: Normal hydration no rash or ulceration CNS: Cranial nerves 2-12 grossly intact no focal lateralizing neurologic deficit   Labs & Imaging Results for orders placed during the hospital encounter of 07/15/11 (from the past 48 hour(s))  CBC     Status: Abnormal   Collection Time   07/15/11  6:18 PM      Component Value Range Comment   WBC 3.7 (*) 4.0 - 10.5 K/uL    RBC 4.18  3.87 - 5.11 MIL/uL    Hemoglobin 13.5  12.0 - 15.0 g/dL    HCT 11.9  14.7 - 82.9 %    MCV 92.8  78.0 - 100.0 fL    MCH 32.3  26.0 - 34.0 pg    MCHC 34.8  30.0 - 36.0 g/dL    RDW 56.2  13.0 - 86.5  %    Platelets 181  150 - 400 K/uL   DIFFERENTIAL     Status: Normal   Collection Time   07/15/11  6:18 PM      Component Value Range Comment   Neutrophils Relative 56  43 - 77 %    Neutro Abs 2.1  1.7 - 7.7 K/uL    Lymphocytes Relative 30  12 - 46 %    Lymphs Abs 1.1  0.7 - 4.0 K/uL    Monocytes Relative 12  3 - 12 %    Monocytes Absolute 0.5  0.1 - 1.0 K/uL    Eosinophils Relative 1  0 - 5 %    Eosinophils Absolute 0.1  0.0 - 0.7 K/uL    Basophils Relative 0  0 - 1 %    Basophils Absolute  0.0  0.0 - 0.1 K/uL   COMPREHENSIVE METABOLIC PANEL     Status: Abnormal   Collection Time   07/15/11  6:18 PM      Component Value Range Comment   Sodium 135  135 - 145 mEq/L    Potassium 3.7  3.5 - 5.1 mEq/L    Chloride 97  96 - 112 mEq/L    CO2 24  19 - 32 mEq/L    Glucose, Bld 132 (*) 70 - 99 mg/dL    BUN 18  6 - 23 mg/dL    Creatinine, Ser 1.61  0.50 - 1.10 mg/dL    Calcium 9.6  8.4 - 09.6 mg/dL    Total Protein 8.3  6.0 - 8.3 g/dL    Albumin 4.2  3.5 - 5.2 g/dL    AST 045 (*) 0 - 37 U/L    ALT 122 (*) 0 - 35 U/L    Alkaline Phosphatase 136 (*) 39 - 117 U/L    Total Bilirubin 0.5  0.3 - 1.2 mg/dL    GFR calc non Af Amer 58 (*) >90 mL/min    GFR calc Af Amer 67 (*) >90 mL/min   LIPASE, BLOOD     Status: Normal   Collection Time   07/15/11  6:18 PM      Component Value Range Comment   Lipase 42  11 - 59 U/L   PROTIME-INR     Status: Normal   Collection Time   07/15/11  6:18 PM      Component Value Range Comment   Prothrombin Time 13.6  11.6 - 15.2 seconds    INR 1.02  0.00 - 1.49   APTT     Status: Normal   Collection Time   07/15/11  6:18 PM      Component Value Range Comment   aPTT 29  24 - 37 seconds   OCCULT BLOOD, POC DEVICE     Status: Normal   Collection Time   07/15/11  6:37 PM      Component Value Range Comment   Fecal Occult Bld NEGATIVE     URINALYSIS, ROUTINE W REFLEX MICROSCOPIC     Status: Abnormal   Collection Time   07/15/11  8:36 PM      Component Value  Range Comment   Color, Urine YELLOW  YELLOW    APPearance CLEAR  CLEAR    Specific Gravity, Urine <1.005 (*) 1.005 - 1.030    pH 5.5  5.0 - 8.0    Glucose, UA NEGATIVE  NEGATIVE mg/dL    Hgb urine dipstick TRACE (*) NEGATIVE    Bilirubin Urine NEGATIVE  NEGATIVE    Ketones, ur NEGATIVE  NEGATIVE mg/dL    Protein, ur NEGATIVE  NEGATIVE mg/dL    Urobilinogen, UA 0.2  0.0 - 1.0 mg/dL    Nitrite NEGATIVE  NEGATIVE    Leukocytes, UA NEGATIVE  NEGATIVE   URINE MICROSCOPIC-ADD ON     Status: Abnormal   Collection Time   07/15/11  8:36 PM      Component Value Range Comment   Squamous Epithelial / LPF MANY (*) RARE    WBC, UA 0-2  <3 WBC/hpf    RBC / HPF 0-2  <3 RBC/hpf    Bacteria, UA FEW (*) RARE   CLOSTRIDIUM DIFFICILE BY PCR     Status: Normal   Collection Time   07/15/11 10:31 PM      Component Value Range Comment   C  difficile by pcr NEGATIVE  NEGATIVE   OCCULT BLOOD, POC DEVICE     Status: Normal   Collection Time   07/15/11 10:47 PM      Component Value Range Comment   Fecal Occult Bld POSITIVE     HEMOGLOBIN AND HEMATOCRIT, BLOOD     Status: Abnormal   Collection Time   07/15/11 11:24 PM      Component Value Range Comment   Hemoglobin 12.1  12.0 - 15.0 g/dL    HCT 16.1 (*) 09.6 - 46.0 %    Ct Abdomen Pelvis W Contrast  07/15/2011  *RADIOLOGY REPORT*  Clinical Data: Abdominal pain, diarrhea left lower quadrant pain  CT ABDOMEN AND PELVIS WITH CONTRAST  Technique:  Multidetector CT imaging of the abdomen and pelvis was performed following the standard protocol during bolus administration of intravenous contrast.  Contrast: OMNIPAQUE IOHEXOL 300 MG/ML  SOLN  Comparison: Report 10/06/2001 no images available  Findings: Sagittal images of the spine shows postsurgical changes lumbar spine at L5-S1 level.  Small hiatal hernia.  Lung bases are unremarkable.  Mild hepatic fatty infiltration.  Mild distended gallbladder.  No calcified gallstones are noted within gallbladder.  The  abdominal aorta is unremarkable.  Spleen, pancreas and adrenals are unremarkable. Kidneys are symmetrical in size and enhancement.  No focal renal mass.  Delayed renal images shows bilateral renal symmetrical excretion.  Bilateral visualized proximal ureter is unremarkable. There is mild gastric distention with debris probable recent ingested food.  No evidence of gastric outlet obstruction.  No small bowel obstruction.  No ascites or free air.  No adenopathy.  There is no pericecal inflammation.  The patient is status post appendectomy.  The urinary bladder is unremarkable.  A few sigmoid colon diverticula are noted without evidence of acute diverticulitis. The patient is status post hysterectomy.  No pelvic ascites or adenopathy.  No destructive bony lesions are noted within pelvis.  Mild degenerative changes are noted right SI joint.  IMPRESSION:  1.  No acute inflammatory process within abdomen or pelvis. 2.  Small hiatal hernia. Moderate gastric distention without evidence of gastric outlet obstruction.  3.  Mild hepatic fatty infiltration. 4.  No hydronephrosis or hydroureter. 5.  Postsurgical changes at L5 S1 level. 6.  Status post appendectomy.  Status post hysterectomy.  Original Report Authenticated By: Natasha Mead, M.D.      Assessment Present on Admission:  .Lower GI bleed .Diverticulosis .Neurosis, anxiety .GERD   PLAN: Admit this lady for empiric cardiovascular management and give her the benefit of a GI consult. Advised her the need to comply into with her physicians, and the need to seek out a psychiatric referral.  Other plans as per orders.   Korey Arroyo 07/16/2011, 2:31 AM

## 2011-07-16 NOTE — Discharge Instructions (Signed)
Tetanus, Diphtheria (Td) or Tetanus, Diphtheria, Pertussis (Tdap) Vaccine What You Need to Know WHY GET VACCINATED? Tetanus, diphtheria and pertussis can be very serious diseases. TETANUS (Lockjaw) causes painful muscle spasms and stiffness, usually all over the body.  Tetanus can lead to tightening of muscles in the head and neck so the victim cannot open his mouth or swallow, or sometimes even breathe. Tetanus kills about 1 out of 5 people who are infected.  DIPHTHERIA can cause a thick membrane to cover the back of the throat.  Diphtheria can lead to breathing problems, paralysis, heart failure, and even death.  PERTUSSIS (Whooping Cough) causes severe coughing spells which can lead to difficulty breathing, vomiting, and disturbed sleep.  Pertussis can lead to weight loss, incontinence, rib fractures and passing out from violent coughing. Up to 2 in 100 adolescents and 5 in 100 adults with pertussis are hospitalized or have complications, including pneumonia and death.  These 3 diseases are all caused by bacteria. Diphtheria and pertussis are spread from person to person. Tetanus enters the body through cuts, scratches, or wounds. The [place] saw as many as 200,000 cases a year of diphtheria and pertussis before vaccines were available, and hundreds of cases of tetanus. Since then, tetanus and diphtheria cases have dropped by about 99% and pertussis cases by about 92%. Children 6 years of age and younger get DTaP vaccine to protect them from these three diseases. But older children, adolescents, and adults need protection too. VACCINES FOR ADOLESCENTS AND ADULTS: TD AND TDAP Two vaccines are available to protect people 7 years of age and older from these diseases:  Td vaccine has been used for many years. It protects against tetanus and diphtheria.   Tdap vaccine was licensed in 2005. It is the first vaccine for adolescents and adults that protects against pertussis as well as tetanus  and diphtheria.  A Td booster dose is recommended every 10 years. Tdap is given only once. WHICH VACCINE, AND WHEN? Ages 7 through 18 years  A dose of Tdap is recommended at age 11 or 12. This dose could be given as early as age 7 for children who missed one or more childhood doses of DTaP.   Children and adolescents who did not get a complete series of DTaP shots by age 7 should complete the series using a combination of Td and Tdap.  Age 19 years and Older  All adults should get a booster dose of Td every 10 years. Adults under 65 who have never gotten Tdap should get a dose of Tdap as their next booster dose. Adults 65 and older may get one booster dose of Tdap.   Adults (including women who may become pregnant and adults 65 and older) who expect to have close contact with a baby younger than 12 months of age should get a dose of Tdap to help protect the baby from pertussis.   Healthcare professionals who have direct patient contact in hospitals or clinics should get one dose of Tdap.  Protection After a Wound  A person who gets a severe cut or burn might need a dose of Td or Tdap to prevent tetanus infection. Tdap should be used for anyone who has never had a dose previously. Td should be used if Tdap is not available, or for:   Anybody who has already had a dose of Tdap.   Children 7 through 9 years of age who completed the childhood DTaP series.   Adults 65 and older.    Pregnant Women  Pregnant women who have never had a dose of Tdap should get one, after the 20th week of gestation and preferably during the 3rd trimester. If they do not get Tdap during their pregnancy they should get a dose as soon as possible after delivery. Pregnant women who have previously received Tdap and need tetanus or diphtheria vaccine while pregnant should get Td.  Tdap and Td may be given at the same time as other vaccines. SOME PEOPLE SHOULD NOT BE VACCINATED OR SHOULD WAIT  Anyone who has had a  life-threatening allergic reaction after a dose of any tetanus, diphtheria, or pertussis containing vaccine should not get Td or Tdap.   Anyone who has a severe allergy to any component of a vaccine should not get that vaccine. Tell your doctor if the person getting the vaccine has any severe allergies.   Anyone who had a coma, or long or multiple seizures within 7 days after a dose of DTP or DTaP should not get Tdap, unless a cause other than the vaccine was found. These people may get Td.   Talk to your doctor if the person getting either vaccine:   Has epilepsy or another nervous system problem.   Had severe swelling or severe pain after a previous dose of DTP, DTaP, DT, Td, or Tdap vaccine.   Has had Guillain Barr Syndrome (GBS).  Anyone who has a moderate or severe illness on the day the shot is scheduled should usually wait until they recover before getting Tdap or Td vaccine. A person with a mild illness or low fever can usually be vaccinated. WHAT ARE THE RISKS FROM TDAP AND TD VACCINES? With a vaccine, as with any medicine, there is always a small risk of a life-threatening allergic reaction or other serious problem. Brief fainting spells and related symptoms (such as jerking movements) can happen after any medical procedure, including vaccination. Sitting or lying down for about 15 minutes after a vaccination can help prevent fainting and injuries caused by falls. Tell your doctor if the patient feels dizzy or lightheaded, or has vision changes or ringing in the ears. Getting tetanus, diphtheria, or pertussis would be much more likely to lead to severe problems than getting either Td or Tdap vaccine. Problems reported after Td and Tdap vaccines are listed below. Mild Problems (noticeable, but did not interfere with activities) Tdap  Pain (about 3 in 4 adolescents and 2 in 3 adults).   Redness or swelling (about 1 in 5).   Mild fever of at least 100.4 F (38 C) (up to about 1 in  25 adolescents and 1 in 100 adults).   Headache (about 4 in 10 adolescents and 3 in 10 adults).   Tiredness (about 1 in 3 adolescents and 1 in 4 adults).   Nausea, vomiting, diarrhea, or stomach ache (up to 1 in 4 adolescents and 1 in 10 adults).   Chills, body aches, sore joints, rash, or swollen glands (uncommon).  Td  Pain (up to about 8 in 10).   Redness or swelling at the injection site (up to about 1 in 3).   Mild fever (up to about 1 in 15).   Headache or tiredness (uncommon).  Moderate Problems (interfered with activities, but did not require medical attention) Tdap  Pain at the injection site (about 1 in 20 adolescents and 1 in 100 adults).   Redness or swelling at the injection site (up to about 1 in 16 adolescents and 1 in 25   adults).   Fever over 102 F (38.9 C) (about 1 in 100 adolescents and 1 in 250 adults).   Headache (1 in 300).   Nausea, vomiting, diarrhea, or stomach ache (up to 3 in 100 adolescents and 1 in 100 adults).  Td  Fever over 102 F (38.9 C) (rare).  Tdap or Td  Extensive swelling of the arm where the shot was given (up to about 3 in 100).  Severe Problems (Unable to perform usual activities; required medical attention) Tdap or Td  Swelling, severe pain, bleeding, and redness in the arm where the shot was given (rare).  A severe allergic reaction could occur after any vaccine. They are estimated to occur less than once in a million doses. WHAT IF THERE IS A SEVERE REACTION? What should I look for? Any unusual condition, such as a severe allergic reaction or a high fever. If a severe allergic reaction occurred, it would be within a few minutes to an hour after the shot. Signs of a serious allergic reaction can include difficulty breathing, weakness, hoarseness or wheezing, a fast heartbeat, hives, dizziness, paleness, or swelling of the throat. What should I do?  Call a doctor, or get the person to a doctor right away.   Tell your doctor  what happened, the date and time it happened, and when the vaccination was given.   Ask your provider to report the reaction by filing a Vaccine Adverse Event Reporting System (VAERS) form. Or, you can file this report through the VAERS website at www.vaers.hhs.gov or by calling 1-800-822-7967.  VAERS does not provide medical advice. THE NATIONAL VACCINE INJURY COMPENSATION PROGRAM The National Vaccine Injury Compensation Program (VICP) was created in 1986. Persons who believe they may have been injured by a vaccine can learn about the program and about filing a claim by calling 1-800-338-2382 or visiting the VICP website at www.hrsa.gov/vaccinecompensation. HOW CAN I LEARN MORE?  Your doctor can give you the vaccine package insert or suggest other sources of information.   Call your local or state health department.   Contact the Centers for Disease Control and Prevention (CDC):   Call 1-800-232-4636 (1-800-CDC-INFO).   Visit the CDC website at www.cdc.gov/vaccines.  CDC Td and Tdap Interim VIS (02/24/10) Document Released: 11/15/2005 Document Revised: 01/07/2011 Document Reviewed: 02/24/2010 [employer] Patient Information 2012 [employer]. 

## 2011-07-16 NOTE — Consult Note (Signed)
Referring Provider: Christiane Ha, MD Primary Care Physician:  Colette Ribas, MD Primary Gastroenterologist:  Roetta Sessions, MD  Reason for Consultation:  Lower GI bleed  HPI: [name] [name] is a 56 y.o. female presented via EMS to [facility] ED yesterday evening after episode of LLQ pain, diarrhea/blood in stool which occurred after eating at [employer] (ate fried shrimp). EMS reported she was hyperventilating. Appeared to be having panic attack. DRE by ED MD showed soft brown heme negative stool. No notable abnormalities.  Later, per nursing, she had small bloody mucousy stool.   Patient had EGD/TCS by Dr. Jena Gauss for reported melena and fecal incontinence back in 10/2010. She had pancolonic diverticulosis and small hiatal hernia. She no showed twice in 11/2010.   Patient states her symptoms began with overwhelming feeling of being hot, broke out in sweat. Ears ringing/top of head tingling. Drenched in sweat. She drove to her sister's house where EMS was called. She had BM there and sister later reported that toilet was full of blood. Has had multiple episodes of left sided abdominal pain followed by stool. Reports 10 stools since hospitalized. States amount of bleeding has improved. Per nursing, streaks of blood. She takes Protonix BID for gerd. Still with some burning. Tried not to eat nothing fried. Used to be on Lortab/nerve pills up until four months ago. Got d/c from [facility]. She saw PCP in [place] but doesn't like her b/c she will not give her nerve pills and Lortab. Takes ibuprofen 600mg  tid for arthritis. Out of nerve pills too. She denies drug use, states it has been years since using cocaine. She had positive UDS for cocaine in 08/2010. Alcohol level of 241 at that time. Came to ED for polysubstance abuse and thoughts of harming herself. She also reports being told she has hepatitis B and C.   She states she has a couple of beers few times per month. Last beer on Sunday.   Prior  to Admission medications   Medication Sig Start Date End Date Taking? Authorizing Provider  Ascorbic Acid (VITAMIN C) 1000 MG tablet Take 1,000 mg by mouth daily.     Yes Historical Provider, MD  ibuprofen (ADVIL,MOTRIN) 200 MG tablet Take 600 mg by mouth 3 (three) times daily as needed. For arthritis pain   Yes Historical Provider, MD  Nutritional Supplements (ESTROVEN) TABS Take 1 tablet by mouth every evening.    Yes Historical Provider, MD  pantoprazole (PROTONIX) 40 MG tablet Take 40 mg by mouth 2 (two) times daily.     Yes Historical Provider, MD  primidone (MYSOLINE) 250 MG tablet Take 250 mg by mouth 3 x daily with food.     Yes Historical Provider, MD  Pseudoephedrine HCl 240 MG TB24 Take 1 tablet by mouth daily.     Yes Historical Provider, MD    Current Facility-Administered Medications  Medication Dose Route Frequency Provider Last Rate Last Dose  . 0.9 %  sodium chloride infusion   Intravenous STAT Laray Anger, DO      . 0.9 % NaCl with KCl 20 mEq/ L  infusion   Intravenous Continuous Vania Rea, MD 100 mL/hr at 07/16/11 0305    . acetaminophen (TYLENOL) tablet 650 mg  650 mg Oral Q6H PRN Vania Rea, MD       Or  . acetaminophen (TYLENOL) suppository 650 mg  650 mg Rectal Q6H PRN Vania Rea, MD      . famotidine (PEPCID) IVPB 20 mg  20 mg  Intravenous Once Laray Anger, DO 100 mL/hr at 07/15/11 1922 20 mg at 07/15/11 1922  . HYDROmorphone (DILAUDID) injection 0.5 mg  0.5 mg Intravenous Q2H PRN Vania Rea, MD      . iohexol (OMNIPAQUE) 300 MG/ML solution 100 mL  100 mL Intravenous Once PRN Medication Radiologist, MD   100 mL at 07/15/11 2020  . morphine 2 MG/ML injection 2 mg  2 mg Intravenous Q1H PRN Laray Anger, DO   2 mg at 07/16/11 0111  . morphine 4 MG/ML injection 4 mg  4 mg Intravenous Q1H PRN Laray Anger, DO   4 mg at 07/15/11 2024  . ondansetron (ZOFRAN) injection 4 mg  4 mg Intravenous Q1H PRN Laray Anger, DO   4 mg  at 07/16/11 0017  . ondansetron (ZOFRAN) injection 4 mg  4 mg Intravenous Q8H PRN Laray Anger, DO      . ondansetron [facility]) injection 4 mg  4 mg Intravenous Q4H PRN Vania Rea, MD      . oxyCODONE (Oxy IR/ROXICODONE) immediate release tablet 5 mg  5 mg Oral Q4H PRN Vania Rea, MD      . pantoprazole (PROTONIX) EC tablet 40 mg  40 mg Oral BID Vania Rea, MD   40 mg at 07/16/11 0327  . pantoprazole (PROTONIX) injection 40 mg  40 mg Intravenous Once Laray Anger, DO   40 mg at 07/15/11 2233  . primidone (MYSOLINE) tablet 250 mg  250 mg Oral TID WC Vania Rea, MD   250 mg at 07/16/11 0327  . traZODone (DESYREL) tablet 25 mg  25 mg Oral QHS PRN Vania Rea, MD      . DISCONTD: 0.9 %  sodium chloride infusion   Intravenous Continuous Laray Anger, DO 125 mL/hr at 07/15/11 1913    . DISCONTD: ondansetron (ZOFRAN) injection 4 mg  4 mg Intravenous Q1H PRN Laray Anger, DO      . DISCONTD: primidone (MYSOLINE) tablet 250 mg  250 mg Oral 3 x daily with food Vania Rea, MD        Allergies as of 07/15/2011 - Review Complete 07/15/2011  Allergen Reaction Noted  . Penicillins Other (See Comments)   . Sulfonamide derivatives      Past Medical History  Diagnosis Date  . Rheumatoid arthritis   . Osteoarthritis   . Epilepsy   . Depression   . GERD (gastroesophageal reflux disease)     Last EGD with 49F dilation by Dr. Jena Gauss 07/29/2006  . Osteopenia   . Schatzki's ring   . Seasonal allergies   . Hemorrhoids 10/2004    Otherwise normal colonoscopy by Dr. Jena Gauss  . Complication of anesthesia   . PONV (postoperative nausea and vomiting)   . Hepatitis B     ?  . Hepatitis C     ?  . Diverticulosis   . Hiatal hernia   . Fecal incontinence   . Polysubstance abuse     positive UDS for cocaine, etoh abuse    Past Surgical History  Procedure Date  . S/p hysterectomy   . Back surgery   . Nose surgery   . Eye surgery     right  . Tubal  ligation   . Appendectomy   . Colonoscopy 10/08/10    pancolonic diverticulosis otherwise normal colon and rectum  . Esophagogastroduodenoscopy 10/08/10    small hiatal hernia otherwise normal  . Rectocele repair     Family History  Problem  Relation Age of Onset  . Leukemia Father   . Alzheimer's disease Mother   . Coronary artery disease Sister   . Anesthesia problems Neg Hx   . Hypotension Neg Hx   . Malignant hyperthermia Neg Hx   . Pseudochol deficiency Neg Hx     History   Social History  . Marital Status: Married    Spouse Name: N/A    Number of Children: N/A  . Years of Education: N/A   Occupational History  . Works at [employer] History Main Topics  . Smoking status: Former Smoker -- 0.5 packs/day    Types: Cigarettes  . Smokeless tobacco: Not on file   Comment: as a teenager  . Alcohol Use: Yes     2-4 beers per month  . Drug Use: No  . Sexually Active: Not on file   Other Topics Concern  . Not on file   Social History Narrative  . No narrative on file     ROS:  General: Negative for anorexia, weight loss, fever, chills, fatigue, weakness. Eyes: Negative for vision changes.  ENT: Negative for hoarseness, difficulty swallowing , nasal congestion. CV: Negative for chest pain, angina, palpitations, dyspnea on exertion, peripheral edema.  Respiratory: Negative for dyspnea at rest, dyspnea on exertion, cough, sputum, wheezing.  GI: See history of present illness. GU:  Negative for dysuria, hematuria, urinary incontinence, urinary frequency, nocturnal urination.  MS: c/o chronic joint pain, low back pain, neck pain.  Derm: Negative for rash or itching.  Neuro: Negative for weakness, abnormal sensation, seizure, frequent headaches, memory loss, confusion. Last seizure 10 yrs ago.  Psych: positive for anxiety, depression. Neg for suicidal ideation, hallucinations.  Endo: Negative for unusual weight change.  Heme: Negative for bruising or  bleeding. Allergy: Negative for rash or hives.       Physical Examination: Vital signs in last 24 hours: Temp:  [97.3 F (36.3 C)-98.4 F (36.9 C)] 98.1 F (36.7 C) (06/14 0627) Pulse Rate:  [95-109] 95  (06/14 0627) Resp:  [18-22] 20  (06/14 0627) BP: (118-155)/(69-95) 152/95 mmHg (06/14 0627) SpO2:  [98 %-100 %] 100 % (06/14 0627) Weight:  [139 lb 15.9 oz (63.5 kg)-140 lb 3.4 oz (63.6 kg)] 140 lb 3.4 oz (63.6 kg) (06/14 0627) Last BM Date: 07/16/11  General: Well-nourished, well-developed in no acute distress. Difficult historian.  Head: Normocephalic, atraumatic.   Eyes: Conjunctiva pink, no icterus. Mouth: Oropharyngeal mucosa moist and pink , no lesions erythema or exudate. Neck: Supple without thyromegaly, masses, or lymphadenopathy.  Lungs: Clear to auscultation bilaterally.  Heart: Regular rate and rhythm, no murmurs rubs or gallops.  Abdomen: Bowel sounds are normal,  nondistended, no hepatosplenomegaly or masses, no abdominal bruits or    hernia , no rebound or guarding.  Left sided tenderness.     Rectal: done in ed Extremities: No lower extremity edema, clubbing, deformity.  Neuro: Alert and oriented x 4 , grossly normal neurologically.  Skin: Warm and dry, no rash or jaundice.   Psych: Alert and cooperative, normal mood and affect.        Intake/Output from previous day: 06/13 0701 - 06/14 0700 In: 1266.7 [I.V.:1266.7] Out: -  Intake/Output this shift:    Lab Results: CBC  Basename 07/16/11 0526 07/15/11 2324 07/15/11 1818  WBC 5.3 -- 3.7*  HGB 12.2 12.1 13.5  HCT 35.3* 34.8* 38.8  MCV 93.1 -- 92.8  PLT 173 -- 181   BMET  Basename 07/16/11 0526  07/15/11 1818  NA 138 135  K 3.6 3.7  CL 102 97  CO2 24 24  GLUCOSE 88 132*  BUN 11 18  CREATININE 0.86 1.05  CALCIUM 9.2 9.6   LFT  Basename 07/16/11 0526 07/15/11 1818  BILITOT 0.3 0.5  BILIDIR -- --  IBILI -- --  ALKPHOS 118* 136*  AST 110* 162*  ALT 94* 122*  PROT 7.5 8.3  ALBUMIN 3.7 4.2    Lab Results  Component Value Date   LIPASE 42 07/15/2011    PT/INR  Basename 07/15/11 1818  LABPROT 13.6  INR 1.02      Imaging Studies: Ct Abdomen Pelvis W Contrast  07/15/2011  *RADIOLOGY REPORT*  Clinical Data: Abdominal pain, diarrhea left lower quadrant pain  CT ABDOMEN AND PELVIS WITH CONTRAST  Technique:  Multidetector CT imaging of the abdomen and pelvis was performed following the standard protocol during bolus administration of intravenous contrast.  Contrast: OMNIPAQUE IOHEXOL 300 MG/ML  SOLN  Comparison: Report 10/06/2001 no images available  Findings: Sagittal images of the spine shows postsurgical changes lumbar spine at L5-S1 level.  Small hiatal hernia.  Lung bases are unremarkable.  Mild hepatic fatty infiltration.  Mild distended gallbladder.  No calcified gallstones are noted within gallbladder.  The abdominal aorta is unremarkable.  Spleen, pancreas and adrenals are unremarkable. Kidneys are symmetrical in size and enhancement.  No focal renal mass.  Delayed renal images shows bilateral renal symmetrical excretion.  Bilateral visualized proximal ureter is unremarkable. There is mild gastric distention with debris probable recent ingested food.  No evidence of gastric outlet obstruction.  No small bowel obstruction.  No ascites or free air.  No adenopathy.  There is no pericecal inflammation.  The patient is status post appendectomy.  The urinary bladder is unremarkable.  A few sigmoid colon diverticula are noted without evidence of acute diverticulitis. The patient is status post hysterectomy.  No pelvic ascites or adenopathy.  No destructive bony lesions are noted within pelvis.  Mild degenerative changes are noted right SI joint.  IMPRESSION:  1.  No acute inflammatory process within abdomen or pelvis. 2.  Small hiatal hernia. Moderate gastric distention without evidence of gastric outlet obstruction.  3.  Mild hepatic fatty infiltration. 4.  No hydronephrosis or  hydroureter. 5.  Postsurgical changes at L5 S1 level. 6.  Status post appendectomy.  Status post hysterectomy.  Original Report Authenticated By: Natasha Mead, M.D.  Pierre.Alas week]   Impression: 56 y/o female with acute onset LLQ pain followed by loose bloody stool. Hgb normal. Recent EGD/TCS in 10/12. She has pancolonic diverticulosis. She denies chronic bowel issues or chronic abdominal pain. CT unremarkable. ? Ischemic colitis with normal CT early in presentation vs benign anorectal bleeding in setting of gastroenteritis.  Patient also has abnormal LFTs. Reported h/o viral hepatitis. AST/ALT ratio not c/w etoh hepatitis. Further w/u needed. Liver looked fatty on CT yesterday.  Plan: 1. Supportive measures.  2. Check viral markers. LFTs in AM. 3. Do not anticipate need for colonoscopy. Will discuss with Dr. Darrick Penna.  4. Clear liquid diet. 5. F/U Urine drug screen.  I would like to thank Dr. Orvan Falconer for allowing [place] to take part in the care of this nice patient.    LOS: 1 day   Tana Coast  07/16/2011, 8:13 AM

## 2011-07-16 NOTE — Progress Notes (Signed)
Subjective: Very talkative in the room.  Has had intermittent episodes of diarrhea.  No other specific complaints.  Patient did report that her dog who has had all her vaccines necessary, per patient, bit her right toes 3 weeks ago.  Second toe is still painful and slightly erythematous.  Objective: Vital signs in last 24 hours: Filed Vitals:   07/16/11 0257 07/16/11 0300 07/16/11 0627 07/16/11 1015  BP: 155/89  152/95 120/80  Pulse: 109  95 96  Temp: 98.4 F (36.9 C)  98.1 F (36.7 C) 99 F (37.2 C)  TempSrc: Oral  Oral Oral  Resp: 19  20 18   Height:  5\' 5"  (1.651 m)    Weight:  63.5 kg (139 lb 15.9 oz) 63.6 kg (140 lb 3.4 oz)   SpO2: 98%  100% 98%   Weight change:   Intake/Output Summary (Last 24 hours) at 07/16/11 1230 Last data filed at 07/16/11 0849  Gross per 24 hour  Intake 1486.67 ml  Output      0 ml  Net 1486.67 ml    Physical Exam: General: Awake, Oriented, No acute distress. HEENT: EOMI. Neck: Supple CV: S1 and S2 Lungs: Clear to ascultation bilaterally Abdomen: Soft, Nontender, Nondistended, hyperactive bowel sounds. Ext: Good pulses. Trace edema.  Right second toe healing ulcerated with slight erythema, able to flex and extend her toe.  Lab Results: Basic Metabolic Panel:  Lab 07/16/11 9528 07/15/11 1818  NA 138 135  K 3.6 3.7  CL 102 97  CO2 24 24  GLUCOSE 88 132*  BUN 11 18  CREATININE 0.86 1.05  CALCIUM 9.2 9.6  MG 2.0 --  PHOS -- --   Liver Function Tests:  Lab 07/16/11 0526 07/15/11 1818  AST 110* 162*  ALT 94* 122*  ALKPHOS 118* 136*  BILITOT 0.3 0.5  PROT 7.5 8.3  ALBUMIN 3.7 4.2    Lab 07/15/11 1818  LIPASE 42  AMYLASE --   No results found for this basename: AMMONIA:5 in the last 168 hours CBC:  Lab 07/16/11 0526 07/15/11 2324 07/15/11 1818  WBC 5.3 -- 3.7*  NEUTROABS -- -- 2.1  HGB 12.2 12.1 13.5  HCT 35.3* 34.8* 38.8  MCV 93.1 -- 92.8  PLT 173 -- 181   Cardiac Enzymes: No results found for this basename:  CKTOTAL:5,CKMB:5,CKMBINDEX:5,TROPONINI:5 in the last 168 hours BNP (last 3 results) No results found for this basename: PROBNP:3 in the last 8760 hours CBG: No results found for this basename: GLUCAP:5 in the last 168 hours No results found for this basename: HGBA1C:5 in the last 72 hours Other Labs: No components found with this basename: POCBNP:3 No results found for this basename: DDIMER:2 in the last 168 hours No results found for this basename: CHOL:2,HDL:2,LDLCALC:2,TRIG:2,CHOLHDL:2,LDLDIRECT:2 in the last 168 hours No results found for this basename: TSH,T4TOTAL,FREET3,T3FREE,FREET4,THYROIDAB in the last 168 hours No results found for this basename: VITAMINB12:2,FOLATE:2,FERRITIN:2,TIBC:2,IRON:2,RETICCTPCT:2 in the last 168 hours  Micro Results: Recent Results (from the past 240 hour(s))  CLOSTRIDIUM DIFFICILE BY PCR     Status: Normal   Collection Time   07/15/11 10:31 PM      Component Value Range Status Comment   C difficile by pcr NEGATIVE  NEGATIVE Final     Studies/Results: Ct Abdomen Pelvis W Contrast  07/15/2011  *RADIOLOGY REPORT*  Clinical Data: Abdominal pain, diarrhea left lower quadrant pain  CT ABDOMEN AND PELVIS WITH CONTRAST  Technique:  Multidetector CT imaging of the abdomen and pelvis was performed following the standard protocol  during bolus administration of intravenous contrast.  Contrast: OMNIPAQUE IOHEXOL 300 MG/ML  SOLN  Comparison: Report 10/06/2001 no images available  Findings: Sagittal images of the spine shows postsurgical changes lumbar spine at L5-S1 level.  Small hiatal hernia.  Lung bases are unremarkable.  Mild hepatic fatty infiltration.  Mild distended gallbladder.  No calcified gallstones are noted within gallbladder.  The abdominal aorta is unremarkable.  Spleen, pancreas and adrenals are unremarkable. Kidneys are symmetrical in size and enhancement.  No focal renal mass.  Delayed renal images shows bilateral renal symmetrical excretion.   Bilateral visualized proximal ureter is unremarkable. There is mild gastric distention with debris probable recent ingested food.  No evidence of gastric outlet obstruction.  No small bowel obstruction.  No ascites or free air.  No adenopathy.  There is no pericecal inflammation.  The patient is status post appendectomy.  The urinary bladder is unremarkable.  A few sigmoid colon diverticula are noted without evidence of acute diverticulitis. The patient is status post hysterectomy.  No pelvic ascites or adenopathy.  No destructive bony lesions are noted within pelvis.  Mild degenerative changes are noted right SI joint.  IMPRESSION:  1.  No acute inflammatory process within abdomen or pelvis. 2.  Small hiatal hernia. Moderate gastric distention without evidence of gastric outlet obstruction.  3.  Mild hepatic fatty infiltration. 4.  No hydronephrosis or hydroureter. 5.  Postsurgical changes at L5 S1 level. 6.  Status post appendectomy.  Status post hysterectomy.  Original Report Authenticated By: Natasha Mead, M.D.    Medications: I have reviewed the patient's current medications. Scheduled Meds:   . sodium chloride   Intravenous STAT  . famotidine  20 mg Intravenous Once  . pantoprazole  40 mg Oral BID  . pantoprazole  40 mg Intravenous Once  . primidone  250 mg Oral TID WC  . DISCONTD: primidone  250 mg Oral 3 x daily with food   Continuous Infusions:   . 0.9 % NaCl with KCl 20 mEq / L 100 mL/hr at 07/16/11 0305  . DISCONTD: sodium chloride 125 mL/hr at 07/15/11 1913   PRN Meds:.acetaminophen, acetaminophen, HYDROmorphone (DILAUDID) injection, iohexol, morphine injection, morphine injection, ondansetron, ondansetron (ZOFRAN) IV, ondansetron (ZOFRAN) IV, oxyCODONE, traZODone, DISCONTD: ondansetron  Assessment/Plan: GI bleed, presumed lower source Hemoglobin stable.  Continue to trend CBC.  Appreciate GI evaluation.  Continue clear liquid diet.  Urine drug screen pending.  Continue PPI.  CT of the  abdomen and pelvis with contrast on 07/15/2011 shows no acute inflammatory process within pelvis or abdomen, small hiatal hernia.  Dog bite on right second toe 3 weeks ago Appears to be healing.  Will get the patient Tdap.  Per patient daughter has had appropriate vaccines.  Start doxycycline for empiric 7 day course.  GERD Continue PPI as indicated above.  Anxiety Stable  Diverticulosis Stable.  No evidence of diverticulitis on imaging.  Prophylaxis SCDs.  CODE STATUS Full code.  Disposition Pending.   LOS: 1 day  Murat Rideout A, MD 07/16/2011, 12:30 PM

## 2011-07-17 DIAGNOSIS — F411 Generalized anxiety disorder: Secondary | ICD-10-CM

## 2011-07-17 DIAGNOSIS — W540XXA Bitten by dog, initial encounter: Secondary | ICD-10-CM

## 2011-07-17 DIAGNOSIS — R197 Diarrhea, unspecified: Secondary | ICD-10-CM

## 2011-07-17 DIAGNOSIS — K625 Hemorrhage of anus and rectum: Secondary | ICD-10-CM

## 2011-07-17 DIAGNOSIS — R7989 Other specified abnormal findings of blood chemistry: Secondary | ICD-10-CM

## 2011-07-17 DIAGNOSIS — R1032 Left lower quadrant pain: Secondary | ICD-10-CM

## 2011-07-17 DIAGNOSIS — K5731 Diverticulosis of large intestine without perforation or abscess with bleeding: Secondary | ICD-10-CM

## 2011-07-17 LAB — CBC
Hemoglobin: 12.9 g/dL (ref 12.0–15.0)
Platelets: 172 10*3/uL (ref 150–400)
RBC: 4.01 MIL/uL (ref 3.87–5.11)
WBC: 4.9 10*3/uL (ref 4.0–10.5)

## 2011-07-17 LAB — HEPATIC FUNCTION PANEL
Albumin: 3.6 g/dL (ref 3.5–5.2)
Bilirubin, Direct: 0.1 mg/dL (ref 0.0–0.3)
Total Bilirubin: 0.4 mg/dL (ref 0.3–1.2)

## 2011-07-17 LAB — RAPID URINE DRUG SCREEN, HOSP PERFORMED
Amphetamines: NOT DETECTED
Benzodiazepines: NOT DETECTED
Cocaine: POSITIVE — AB

## 2011-07-17 MED ORDER — SODIUM CHLORIDE 0.9 % IJ SOLN
INTRAMUSCULAR | Status: AC
  Filled 2011-07-17: qty 3

## 2011-07-17 MED ORDER — ACETAMINOPHEN 325 MG PO TABS: 650.0000 mg | ORAL_TABLET | Freq: Four times a day (QID) | ORAL | Status: AC | PRN

## 2011-07-17 MED ORDER — DOXYCYCLINE HYCLATE 100 MG PO TABS: 100.0000 mg | ORAL_TABLET | Freq: Two times a day (BID) | ORAL | Status: AC

## 2011-07-17 NOTE — Progress Notes (Signed)
Pt. Discharged to lobby via wheelchair. Family member will drive pt. Home. Currently, pt. Comfortable with recent pain med and voices no discomfort. Discharge instructiond and meds reviewed with pt with good understanding. No acute distress noted.

## 2011-07-17 NOTE — Progress Notes (Signed)
Subjective;  Patient states that she has no passed any blood per rectum since yesterday afternoon. She complains of discomfort at LLQ. She ate all of her breakfast. She denies nausea or vomiting. She has been ambulating in the hall without any difficulty. Objective; BP 142/93  Pulse 91  Temp 98.2 F (36.8 C) (Oral)  Resp 20  Ht 5\' 5"  (1.651 m)  Wt 155 lb 8 oz (70.534 kg)  BMI 25.88 kg/m2  SpO2 99% Patient appears comfortable. Abdomen is soft with mild LLQ tenderness but no rebound or guarding. No organomegaly or masses. Lab data H&H is 12.9 and 37.8. H&H yesterday was 12.2 and 35.3 Drug screen positive for cocaine and barbiturates. AST 74, ALT 74 Hepatitis B surface antigen is negative. HCV antibody is reactive. Colonoscopy records from September 2012 reviewed. Assessment; #1. Lower GI bleed most likely secondary to colonic diverticulosis possibly triggered by heavy NSAID use. H&H is is normal and up sinc yesterday. #2. Chronic hepatitis C. She appears to have preserved hepatic function. #3. Drug screen positive for cocaine and barbiturates. I did not bring this up with the patient. Recommendations; Patient advised to use NSAIDs only when absolutely necessary. Stable from a GI standpoint to be discharged. She will followup with Dr. Jena Gauss

## 2011-07-17 NOTE — Discharge Summary (Signed)
Physician Discharge Summary  Patient ID: [name] [name] MRN: [id number] DOB/AGE: Apr 07, 1955 56 y.o.  Admit date: 07/15/2011 Discharge date: 07/17/2011  Discharge Diagnoses:  Principal Problem:  *Lower GI bleed Active Problems:  GERD  Diverticulosis  Neurosis, anxiety  Dog Bite Of Toe polysubstance abuse  Medication List  As of 07/17/2011 11:34 AM   STOP taking these medications         ibuprofen 200 MG tablet      Pseudoephedrine HCl 240 MG Tb24         TAKE these medications         acetaminophen 325 MG tablet   Commonly known as: TYLENOL   Take 2 tablets (650 mg total) by mouth every 6 (six) hours as needed (or Fever >/= 101).      doxycycline 100 MG tablet   Commonly known as: VIBRA-TABS   Take 1 tablet (100 mg total) by mouth every 12 (twelve) hours.      Estroven Tabs   Take 1 tablet by mouth every evening.      pantoprazole 40 MG tablet   Commonly known as: PROTONIX   Take 40 mg by mouth 2 (two) times daily.      primidone 250 MG tablet   Commonly known as: MYSOLINE   Take 250 mg by mouth 3 x daily with food.      vitamin C 1000 MG tablet   Take 1,000 mg by mouth daily.            Discharge Orders    Future Orders Please Complete By Expires   Diet general         Follow-up Information    Follow up with your doctor. (As needed)         Disposition: 01-Home or Self Care  Discharged Condition: stable  Consults: Treatment Team:  [place], MD Malissa Hippo, MD  Labs:    Sodium   138           Potassium   3.6           Chloride   102           CO2   24           BUN   11           Creatinine, Ser   0.86           Calcium   9.2           GFR calc non Af Amer   74           GFR calc Af Amer   85            Glucose, Bld   88           Magnesium   2.0           Alkaline Phosphatase   118 111          Albumin   3.7 3.6          Lipase   42           AST   110 74          ALT   94 74          Total Protein   7.5 7.5            Bilirubin, Direct    0.1          Indirect Bilirubin  0.3          Total Bilirubin   0.3 0.4           CBC    WBC   5.3 4.9          RBC   3.79 4.01          Hemoglobin   12.2 12.9          HCT   35.3 37.8          MCV   93.1 94.3          MCH   32.2 32.2          MCHC   34.6 34.1          RDW   12.5 12.6          Platelets   173 172           DIFFERENTIAL    Neutrophils Relative   56           Lymphocytes Relative   30           Monocytes Relative   12           Eosinophils Relative   1           Basophils Relative   0           Neutro Abs   2.1           Lymphs Abs   1.1           Monocytes Absolute   0.5           Eosinophils Absolute   0.1           Basophils Absolute   0.0            PROTIME W/ INR    Prothrombin Time   13.6           INR   1.02            PTT    aPTT   29            DIABETES    Glucose, Bld   88            THYROID    TSH   2.833            HEPATITIS B    Hepatitis B Surface Ag    NEGATIVE           HEPATITIS C    HCV Ab    Reactive            URINALYSIS    Color, Urine   YELLOW           APPearance   CLEAR           Specific Gravity, Urine   <1.005           pH   5.5           Glucose, UA   NEGATIVE           Bilirubin Urine   NEGATIVE           Ketones, ur   NEGATIVE           Protein, ur   NEGATIVE           Urobilinogen, UA   0.2           Nitrite   NEGATIVE  Leukocytes, UA   NEGATIVE           Hgb urine dipstick   TRACE           WBC, UA   0-2           RBC / HPF   0-2           Squamous Epithelial / LPF   MANY           Bacteria, UA   FEW            STOOL TESTS    C difficile by pcr   NEGATIVE            TOX, URINE    Amphetamines    NONE DETECTED          Barbiturates    POSITIVE           Benzodiazepines    NONE DETECTED          Opiates    NONE DETECTED          Cocaine    POSITIVE          Tetrahydrocannabinol    NONE           Diagnostics:  Ct Abdomen Pelvis W Contrast  07/15/2011   *RADIOLOGY REPORT*  Clinical Data: Abdominal pain, diarrhea left lower quadrant pain  CT ABDOMEN AND PELVIS WITH CONTRAST  Technique:  Multidetector CT imaging of the abdomen and pelvis was performed following the standard protocol during bolus administration of intravenous contrast.  Contrast: OMNIPAQUE IOHEXOL 300 MG/ML  SOLN  Comparison: Report 10/06/2001 no images available  Findings: Sagittal images of the spine shows postsurgical changes lumbar spine at L5-S1 level.  Small hiatal hernia.  Lung bases are unremarkable.  Mild hepatic fatty infiltration.  Mild distended gallbladder.  No calcified gallstones are noted within gallbladder.  The abdominal aorta is unremarkable.  Spleen, pancreas and adrenals are unremarkable. Kidneys are symmetrical in size and enhancement.  No focal renal mass.  Delayed renal images shows bilateral renal symmetrical excretion.  Bilateral visualized proximal ureter is unremarkable. There is mild gastric distention with debris probable recent ingested food.  No evidence of gastric outlet obstruction.  No small bowel obstruction.  No ascites or free air.  No adenopathy.  There is no pericecal inflammation.  The patient is status post appendectomy.  The urinary bladder is unremarkable.  A few sigmoid colon diverticula are noted without evidence of acute diverticulitis. The patient is status post hysterectomy.  No pelvic ascites or adenopathy.  No destructive bony lesions are noted within pelvis.  Mild degenerative changes are noted right SI joint.  IMPRESSION:  1.  No acute inflammatory process within abdomen or pelvis. 2.  Small hiatal hernia. Moderate gastric distention without evidence of gastric outlet obstruction.  3.  Mild hepatic fatty infiltration. 4.  No hydronephrosis or hydroureter. 5.  Postsurgical changes at L5 S1 level. 6.  Status post appendectomy.  Status post hysterectomy.  Original Report Authenticated By: Natasha Mead, M.D.   Procedures: none  EKG:   Full Code    Hospital Course:  See H&P for complete admission details. The patient presented to the with reports of vomiting diarrhea, panic attack. She reported blood in her stool. When evaluated by the hospitalist in the emergency room, blood pressure was 128/82. Pulse 101. Anxious. Stool was reportedly heme positive per ED physician. She had mild generalized abdominal tenderness. White blood cell count was 3.7. Hemoglobin 13.5.  CT of the abdomen showed mild hepatic fatty infiltration moderate gastric distention without evidence of gastric outlet obstruction, post appendectomy, post hysterectomy.  Patient was admitted to the hospitalist service and GI was consulted. Stool was negative for C. difficile. Urinalysis positive for barbiturates and cocaine. Patient had no further bleeding. Her hemoglobin remained stable. She reported using NSAIDs frequently. She likely had a limited bleed from diverticulosis. She did not require endoscopy. She did report a dog bite recently and her toe was slightly red. She was started on antibiotics. Throughout the hospitalization she was requesting pain medication. She had been fired from [employer] in the past and plans on seeing Dr. Sherril Croon. With her history of polysubstance abuse, I did not give a prescription for opiate analgesics. She was confronted about her positive urine drug screen but denied any use. Total time on the day of discharge greater than 30 minutes.  Discharge Exam:  Blood pressure 142/93, pulse 91, temperature 98.2 F (36.8 C), temperature source Oral, resp. rate 20, height 5\' 5"  (1.651 m), weight 70.534 kg (155 lb 8 oz), SpO2 99.00%.  General:  Talkative. Walking around. Abd: S, NT, ND Ext: Toe slightly red. No drainage  Signed: Glenora Morocho L 07/17/2011, 11:34 AM

## 2011-07-19 ENCOUNTER — Encounter: Payer: Self-pay | Admitting: Gastroenterology

## 2011-07-19 ENCOUNTER — Telehealth: Payer: Self-pay | Admitting: Gastroenterology

## 2011-07-19 NOTE — Telephone Encounter (Signed)
Routing to Susan Sherman to schedule.  

## 2011-07-19 NOTE — Telephone Encounter (Signed)
Needs OV in 3-4 weeks for f/u hospitalization for gi bleed and for positive HCV ab.

## 2011-07-19 NOTE — Telephone Encounter (Signed)
Pt is aware of OV on 7/15 @ 0930 with [facility] and appt card was mailed

## 2011-07-20 LAB — STOOL CULTURE

## 2011-07-22 NOTE — Progress Notes (Signed)
UR Chart Review Completed  

## 2011-08-16 ENCOUNTER — Telehealth: Payer: Self-pay | Admitting: Gastroenterology

## 2011-08-16 ENCOUNTER — Ambulatory Visit: Payer: Medicare Other | Admitting: Gastroenterology

## 2011-08-16 NOTE — Telephone Encounter (Signed)
Would offer reschedule once more.

## 2011-08-16 NOTE — Telephone Encounter (Signed)
Pt was a no show

## 2011-08-17 ENCOUNTER — Encounter: Payer: Self-pay | Admitting: Gastroenterology

## 2011-08-17 NOTE — Telephone Encounter (Signed)
Mailed letter to patient Do Not Neglect your health

## 2011-12-15 ENCOUNTER — Ambulatory Visit: Payer: Medicare Other | Admitting: Gastroenterology

## 2011-12-22 ENCOUNTER — Ambulatory Visit (INDEPENDENT_AMBULATORY_CARE_PROVIDER_SITE_OTHER): Payer: Medicare Other | Admitting: Gastroenterology

## 2011-12-22 ENCOUNTER — Encounter: Payer: Self-pay | Admitting: Gastroenterology

## 2011-12-22 VITALS — BP 115/72 | HR 68 | Temp 98.0°F | Ht 62.0 in | Wt 157.8 lb

## 2011-12-22 DIAGNOSIS — K219 Gastro-esophageal reflux disease without esophagitis: Secondary | ICD-10-CM

## 2011-12-22 DIAGNOSIS — B1920 Unspecified viral hepatitis C without hepatic coma: Secondary | ICD-10-CM

## 2011-12-22 DIAGNOSIS — R109 Unspecified abdominal pain: Secondary | ICD-10-CM

## 2011-12-22 MED ORDER — DICYCLOMINE HCL 10 MG PO CAPS: 10.0000 mg | ORAL_CAPSULE | Freq: Three times a day (TID) | ORAL | Status: DC

## 2011-12-22 MED ORDER — RABEPRAZOLE SODIUM 20 MG PO TBEC: 20.0000 mg | DELAYED_RELEASE_TABLET | Freq: Two times a day (BID) | ORAL | Status: DC

## 2011-12-22 NOTE — Patient Instructions (Addendum)
Stop Protonix. Start taking Aciphex twice a day. I have sent this to your pharmacy.  Review the gas diet handout. Add supplemental fiber to your diet (Metamucil, Benefiber). I have provided samples.  Start taking a probiotic daily. Some examples include Digestive Advantage and Philip's Colon Health. We have given you Align to try, but this is expensive over the counter.  Finally, take Bentyl before meals and at bedtime. This is for the belly cramping and loose stools.   Please have blood work completed.

## 2011-12-22 NOTE — Progress Notes (Signed)
Referring Provider: Assunta Found, MD Primary Care Physician:  Ignatius Specking., MD PRIMARY GI: DR. Jena Gauss   Chief Complaint  Patient presents with  . Follow-up    HPI:   56 year old female presenting today in follow-up from hospitalization this summer; she has no-showed several appointments. Inpatient in June 2013 secondary to likely diverticular bleed. At time of hospitalization, noted abnormal LFTs, fatty liver on CT. +HCV antibody. TCS on file by Dr. Jena Gauss from Sept 2012 with pancolonic diverticulosis. EGD at this time as well normal.  +belching upon entering room. States chronic. Wants to change stomach pills. On Protonix BID. Has taken Nexium in the past. Prilosec in the past. Drinks a lot of decaffeinated tea. Prior hx of Schatzki's ring s/p multiple dilations and peptic stricture in the remote past. No dysphagia currently. Chronic belching upon review of records, dating back to 2006.   Wants refill on hemorrhoid meds. States every few months has itching/burning. Uses suppository and has immediate relief. Has "irregular bowel syndrome". States back and forth between diarrhea and constipation.  Not eating high fiber diet. Because it is "expensive". Eats pintos and cornbreads. Rare nausea, once or twice a week. No NSAIDs. Having cramps in LLQ, intermittent. Doubles her over. "Sharp". Especially if eats salad with lots of lettuce. Followed by diarrhea. Eats a lot of yogurt.   Very talkative, flight of ideas  Past Medical History  Diagnosis Date  . Rheumatoid arthritis   . Osteoarthritis   . Epilepsy   . Depression   . GERD (gastroesophageal reflux disease)     Last EGD with 37F dilation by Dr. Jena Gauss 07/29/2006  . Osteopenia   . Schatzki's ring   . Seasonal allergies   . Hemorrhoids 10/2004    Otherwise normal colonoscopy by Dr. Jena Gauss  . Complication of anesthesia   . PONV (postoperative nausea and vomiting)   . Diverticulosis   . Hiatal hernia   . Fecal incontinence   . Polysubstance  abuse     positive UDS for cocaine, etoh abuse  . Hepatitis C antibody test positive     confirmed positive ab during 07/2011 hospitalization  . Schatzki's ring     multiple dilations  . Peptic stricture of esophagus     2006    Past Surgical History  Procedure Date  . S/p hysterectomy   . Back surgery   . Nose surgery   . Eye surgery     right  . Tubal ligation   . Appendectomy   . Colonoscopy 10/08/10    pancolonic diverticulosis otherwise normal colon and rectum  . Esophagogastroduodenoscopy 10/08/10    small hiatal hernia otherwise normal  . Rectocele repair     Current Outpatient Prescriptions  Medication Sig Dispense Refill  . acetaminophen (TYLENOL) 325 MG tablet Take 2 tablets (650 mg total) by mouth every 6 (six) hours as needed (or Fever >/= 101).      . Ascorbic Acid (VITAMIN C) 1000 MG tablet Take 1,000 mg by mouth daily.        . citalopram (CELEXA) 20 MG tablet Take 20 mg by mouth daily.       . cyclobenzaprine (FLEXERIL) 10 MG tablet Take 10 mg by mouth 2 (two) times daily as needed.       . Nutritional Supplements (ESTROVEN) TABS Take 1 tablet by mouth every evening.       . pantoprazole (PROTONIX) 40 MG tablet Take 40 mg by mouth 2 (two) times daily.        Marland Kitchen  primidone (MYSOLINE) 250 MG tablet Take 250 mg by mouth 3 x daily with food.        . dicyclomine (BENTYL) 10 MG capsule Take 1 capsule (10 mg total) by mouth 4 (four) times daily -  before meals and at bedtime.  120 capsule  3  . RABEprazole (ACIPHEX) 20 MG tablet Take 1 tablet (20 mg total) by mouth 2 (two) times daily.  60 tablet  3    Allergies as of 12/22/2011 - Review Complete 12/22/2011  Allergen Reaction Noted  . Penicillins Other (See Comments)   . Sulfonamide derivatives      Family History  Problem Relation Age of Onset  . Leukemia Father   . Alzheimer's disease Mother   . Coronary artery disease Sister   . Anesthesia problems Neg Hx   . Hypotension Neg Hx   . Malignant hyperthermia Neg  Hx   . Pseudochol deficiency Neg Hx   . Colon cancer Neg Hx     History   Social History  . Marital Status: Married    Spouse Name: N/A    Number of Children: 1  . Years of Education: N/A   Occupational History  . disability   .     Social History Main Topics  . Smoking status: Former Smoker -- 0.5 packs/day    Types: Cigarettes  . Smokeless tobacco: None     Comment: quit as a teenager  . Alcohol Use: Yes     Comment: 2-4 beers per month. Last beer on Sunday.   . Drug Use: No     Comment: h/o cocaine in past; none since June   . Sexually Active: None   Other Topics Concern  . None   Social History Narrative  . None    Review of Systems: Gen: Denies fever, chills, anorexia. Denies fatigue, weakness, weight loss.  CV: Denies chest pain, palpitations, syncope, peripheral edema, and claudication. Resp: Denies dyspnea at rest, cough, wheezing, coughing up blood, and pleurisy. GI: Denies vomiting blood, jaundice, and fecal incontinence.   Denies dysphagia or odynophagia. Derm: Denies rash, itching, dry skin Psych: Denies depression, anxiety, memory loss, confusion. No homicidal or suicidal ideation.  Heme: Denies bruising, bleeding, and enlarged lymph nodes.  Physical Exam: BP 115/72  Pulse 68  Temp 98 F (36.7 C) (Temporal)  Ht 5\' 2"  (1.575 m)  Wt 157 lb 12.8 oz (71.578 kg)  BMI 28.86 kg/m2 General:   Alert and oriented. No distress noted. Pleasant and cooperative. Very talkative Head:  Normocephalic and atraumatic. Eyes:  Conjuctiva clear without scleral icterus. Mouth:  Oral mucosa pink and moist.  Neck:  Supple, without mass or thyromegaly. Heart:  S1, S2 present without murmurs, rubs, or gallops. Regular rate and rhythm. Abdomen:  +BS, soft, non-tender and non-distended. No rebound or guarding. No HSM or masses noted. Msk:  Symmetrical without gross deformities. Normal posture. Extremities:  Without edema. Neurologic:  Alert and  oriented x4;  grossly  normal neurologically. Skin:  Intact without significant lesions or rashes. Cervical Nodes:  No significant cervical adenopathy. Psych:  Alert and cooperative. Appears somewhat manic. Quite talkative, hard to obtain complete hx due to jumping from topic to topic.

## 2011-12-23 ENCOUNTER — Encounter: Payer: Self-pay | Admitting: Gastroenterology

## 2011-12-23 DIAGNOSIS — B1920 Unspecified viral hepatitis C without hepatic coma: Secondary | ICD-10-CM | POA: Insufficient documentation

## 2011-12-24 NOTE — Progress Notes (Signed)
Faxed to PCP

## 2011-12-24 NOTE — Assessment & Plan Note (Signed)
Chronic. +BELCHING. Noted dating back to 2006. Has taken both Nexium, Prilosec, and Protonix BID in past. Trial of Aciphex BID. GERD diet provided. Return in 3 months.

## 2011-12-24 NOTE — Assessment & Plan Note (Signed)
+  HCV antibody during inpatient hospitalization in summer 2013. No RNA on file. Fatty liver on CT during hospitalization. Need updated HFP, RNQ quantitative with reflex genotype. If active, refer to Hep C clinic, obtain vaccinations, further labs as indicated.

## 2011-12-24 NOTE — Assessment & Plan Note (Signed)
Likely r/t IBS. Recent TCS on file with pancolonic diverticulosis. LLQ noted, followed by loose stools. Trial of Bentyl, add supplemental fiber. Finances are an issue for eating healthy, as pt mentioned. Add probiotic. Return in 3 months.

## 2011-12-28 ENCOUNTER — Encounter: Payer: Self-pay | Admitting: Gastroenterology

## 2011-12-28 NOTE — Progress Notes (Signed)
Pt is aware of OV on 03/29/12 @ 11 am with [facility] and appt card was mailed

## 2012-03-29 ENCOUNTER — Telehealth: Payer: Self-pay | Admitting: Gastroenterology

## 2012-03-29 ENCOUNTER — Ambulatory Visit: Payer: Medicare Other | Admitting: Gastroenterology

## 2012-03-29 NOTE — Telephone Encounter (Signed)
Pt was a no show

## 2012-05-09 ENCOUNTER — Other Ambulatory Visit: Payer: Self-pay

## 2012-05-09 MED ORDER — DEXLANSOPRAZOLE 60 MG PO CPDR: 60.0000 mg | DELAYED_RELEASE_CAPSULE | Freq: Every day | ORAL | Status: DC

## 2012-06-27 ENCOUNTER — Encounter: Payer: Self-pay | Admitting: General Practice

## 2012-07-03 ENCOUNTER — Other Ambulatory Visit: Payer: Self-pay | Admitting: Gastroenterology

## 2012-07-04 NOTE — Telephone Encounter (Signed)
Pt has OV on 6/12 at 130 with [facility]

## 2012-07-04 NOTE — Telephone Encounter (Signed)
Needs f/u appt with [place]. I have refilled Bentyl X 1. Has no-showed in interim from last appt.

## 2012-07-12 ENCOUNTER — Encounter: Payer: Self-pay | Admitting: Internal Medicine

## 2012-07-13 ENCOUNTER — Telehealth: Payer: Self-pay | Admitting: Gastroenterology

## 2012-07-13 ENCOUNTER — Ambulatory Visit: Payer: Medicare Other | Admitting: Gastroenterology

## 2012-07-13 NOTE — Telephone Encounter (Signed)
Pt was a no show

## 2012-07-13 NOTE — Telephone Encounter (Signed)
Please send note to reschedule.  

## 2012-07-24 ENCOUNTER — Encounter: Payer: Self-pay | Admitting: Gastroenterology

## 2012-07-24 NOTE — Telephone Encounter (Signed)
Mailed letter for patient to call our office to [facility] OV

## 2012-08-10 ENCOUNTER — Telehealth: Payer: Self-pay | Admitting: Gastroenterology

## 2012-08-10 ENCOUNTER — Ambulatory Visit: Payer: Medicare Other | Admitting: Gastroenterology

## 2012-08-10 NOTE — Telephone Encounter (Signed)
Susan-please send a letter to the patient and pcp that we will require another referral prior to seeing this patient.  Thanks!

## 2012-08-10 NOTE — Telephone Encounter (Signed)
Patient has a history of multiple no-shows as documented in epic.  This year alone, she has had 3. Last year she had multiple no shows as well.  Routing to Dr. Jena Gauss and Durward Mallard, office manager for final recommendations.

## 2012-08-10 NOTE — Telephone Encounter (Signed)
Pt was a no show

## 2012-08-14 ENCOUNTER — Encounter: Payer: Self-pay | Admitting: General Practice

## 2012-08-14 NOTE — Telephone Encounter (Signed)
Per Dr. Geni Bers, we will mail a discharge letter to the patient

## 2012-09-01 ENCOUNTER — Other Ambulatory Visit: Payer: Self-pay

## 2012-09-18 NOTE — Progress Notes (Signed)
Noted  

## 2012-09-18 NOTE — Progress Notes (Signed)
pts discharge letter came back to office as unclaimed. Letter was remailed to pt by [employer] and copy of envelope was sent to be scanned into pts chart.

## 2012-09-20 NOTE — Telephone Encounter (Signed)
Pt called and was informed about d/c letter.

## 2013-04-05 NOTE — Telephone Encounter (Signed)
Open in error

## 2013-04-16 NOTE — Telephone Encounter (Signed)
She is no longer our patient.

## 2016-10-07 ENCOUNTER — Ambulatory Visit (INDEPENDENT_AMBULATORY_CARE_PROVIDER_SITE_OTHER): Payer: Medicare Other | Admitting: Orthopaedic Surgery

## 2016-10-07 ENCOUNTER — Encounter: Payer: Self-pay | Admitting: Orthopaedic Surgery

## 2016-10-07 VITALS — BP 116/71 | HR 63 | Temp 97.5°F

## 2016-10-07 DIAGNOSIS — S99191A Other physeal fracture of right metatarsal, initial encounter for closed fracture: Secondary | ICD-10-CM | POA: Diagnosis not present

## 2016-10-07 DIAGNOSIS — S8261XA Displaced fracture of lateral malleolus of right fibula, initial encounter for closed fracture: Secondary | ICD-10-CM

## 2016-10-07 NOTE — Progress Notes (Signed)
Subjective:    Patient ID: [name] [name], female    DOB: 02-Apr-1955, 61 y.o.   MRN: [id number]  HPI She fell and hurt her right ankle and foot on 10-02-16.  She was seen in [place] and had X-rays done showing fracture of the right lateral malleolus and the right fifth metatarsal. She was placed in a posterior splint and given crutches.  She has no other injury.  She is on chronic Oxycodene medicine from Dr. Woody Seller.   Review of Systems  HENT: Negative for congestion.   Respiratory: Negative for cough and shortness of breath.   Cardiovascular: Negative for chest pain and leg swelling.  Endocrine: Positive for cold intolerance.  Musculoskeletal: Positive for arthralgias, back pain, gait problem and joint swelling.  Allergic/Immunologic: Positive for environmental allergies.  Psychiatric/Behavioral: The patient is nervous/anxious.    Past Medical History:  Diagnosis Date  . Complication of anesthesia   . Depression   . Diverticulosis   . Epilepsy ([place])   . Fecal incontinence   . Fractures   . GERD (gastroesophageal reflux disease)    Last EGD with 76F dilation by Dr. Gala Romney 07/29/2006  . Hemorrhoids 10/2004   Otherwise normal colonoscopy by Dr. Gala Romney  . Hepatitis C antibody test positive    confirmed positive ab during 07/2011 hospitalization  . Hiatal hernia   . Osteoarthritis   . Osteopenia   . Peptic stricture of esophagus    2006  . Polysubstance abuse    positive UDS for cocaine, etoh abuse  . PONV (postoperative nausea and vomiting)   . Rheumatoid arthritis(714.0)   . Schatzki's ring   . Schatzki's ring    multiple dilations  . Seasonal allergies     Past Surgical History:  Procedure Laterality Date  . APPENDECTOMY    . BACK SURGERY    . COLONOSCOPY  10/08/10   [id number] diverticulosis otherwise normal colon and rectum  . ESOPHAGOGASTRODUODENOSCOPY  10/08/10   FAO:[id number] hiatal hernia otherwise normal  . EYE SURGERY     right  . NOSE SURGERY    . RECTOCELE  REPAIR    . S/P Hysterectomy    . TUBAL LIGATION      Current Outpatient Prescriptions on File Prior to Visit  Medication Sig Dispense Refill  . Ascorbic Acid (VITAMIN C) 1000 MG tablet Take 1,000 mg by mouth daily.      . citalopram (CELEXA) 20 MG tablet Take 20 mg by mouth daily.     . cyclobenzaprine (FLEXERIL) 10 MG tablet Take 10 mg by mouth 2 (two) times daily as needed.     Marland Kitchen dexlansoprazole (DEXILANT) 60 MG capsule Take 1 capsule (60 mg total) by mouth daily. 31 capsule 3  . dicyclomine (BENTYL) 10 MG capsule TAKE ONE CAPSULE BY MOUTH 4 TIMES DAILY (BEFORE  MEALS  AND  AT  BEDTIME) 120 capsule 0  . Nutritional Supplements (ESTROVEN) TABS Take 1 tablet by mouth every evening.     . pantoprazole (PROTONIX) 40 MG tablet Take 40 mg by mouth 2 (two) times daily.      . primidone (MYSOLINE) 250 MG tablet Take 250 mg by mouth 3 x daily with food.      . RABEprazole (ACIPHEX) 20 MG tablet Take 1 tablet (20 mg total) by mouth 2 (two) times daily. 60 tablet 3   No current facility-administered medications on file prior to visit.     Social History   Social History  . Marital status: Married  Spouse name: N/A  . Number of children: 1  . Years of education: N/A   Occupational History  . disability   .  Not Employed   Social History Main Topics  . Smoking status: Former Smoker    Packs/day: 0.50    Types: Cigarettes  . Smokeless tobacco: Never Used     Comment: quit as a teenager  . Alcohol use Yes     Comment: 2-4 beers per month. Last beer on Sunday.   . Drug use: No     Comment: h/o cocaine in past; none since June   . Sexual activity: Not on file   Other Topics Concern  . Not on file   Social History Narrative  . No narrative on file    Family History  Problem Relation Age of Onset  . Leukemia Father   . Alzheimer's disease Mother   . Coronary artery disease Sister   . Anesthesia problems Neg Hx   . Hypotension Neg Hx   . Malignant hyperthermia Neg Hx   .  Pseudochol deficiency Neg Hx   . Colon cancer Neg Hx     BP 116/71   Pulse 63   Temp (!) 97.5 F (36.4 C)      Objective:   Physical Exam  Constitutional: She is oriented to person, place, and time. She appears well-developed and well-nourished.  HENT:  Head: Normocephalic and atraumatic.  Eyes: Pupils are equal, round, and reactive to light. Conjunctivae and EOM are normal.  Neck: Normal range of motion. Neck supple.  Cardiovascular: Normal rate, regular rhythm and intact distal pulses.   Pulmonary/Chest: Effort normal.  Abdominal: Soft.  Musculoskeletal: She exhibits tenderness (right ankle with lateral swelling and pain and ecchymosis.  Ecchymosis dorsal foot and lateral right foot.  NV intact. ROM painful.  In wheelchair.  Left side negative.).  Neurological: She is alert and oriented to person, place, and time. She displays normal reflexes. No cranial nerve deficit. She exhibits normal muscle tone. Coordination normal.  Skin: Skin is warm and dry.  Psychiatric: She has a normal mood and affect. Her behavior is normal. Judgment and thought content normal.  Vitals reviewed.         Assessment & Plan:   Encounter Diagnoses  Name Primary?  . Closed displaced fracture of lateral malleolus of right fibula, initial encounter Yes  . Fracture of base of fifth metatarsal bone of right foot at metaphyseal-diaphyseal junction, closed, initial encounter    She was placed in CAM walker with instructions.  Contrast bath sheet given.    Return in one week. X-rays on return of the right foot and right ankle.  Call if any problem.  Precautions discussed.   Electronically Signed Sanjuana Kava, MD 9/6/20182:25 PM

## 2016-10-14 ENCOUNTER — Ambulatory Visit (INDEPENDENT_AMBULATORY_CARE_PROVIDER_SITE_OTHER): Payer: Medicare Other

## 2016-10-14 ENCOUNTER — Ambulatory Visit (INDEPENDENT_AMBULATORY_CARE_PROVIDER_SITE_OTHER): Payer: Medicare Other | Admitting: Orthopaedic Surgery

## 2016-10-14 DIAGNOSIS — S8261XD Displaced fracture of lateral malleolus of right fibula, subsequent encounter for closed fracture with routine healing: Secondary | ICD-10-CM | POA: Diagnosis not present

## 2016-10-14 DIAGNOSIS — S99191A Other physeal fracture of right metatarsal, initial encounter for closed fracture: Secondary | ICD-10-CM

## 2016-10-14 MED ORDER — HYDROCODONE-ACETAMINOPHEN 5-325 MG PO TABS: 1.0000 | ORAL_TABLET | Freq: Four times a day (QID) | ORAL | 0 refills | Status: DC | PRN

## 2016-10-14 NOTE — Progress Notes (Signed)
CC:  My foot is still sore  She is using the CAM walker and keeping her weight off it. She still has pain.  NV intact. She has resolving ecchymosis.  She has lateral edema.  Encounter Diagnoses  Name Primary?  . Closed displaced fracture of lateral malleolus of right fibula with routine healing, subsequent encounter Yes  . Fracture of base of fifth metatarsal bone of right foot at metaphyseal-diaphyseal junction, closed, with routine healing, subsequent encounter     X-rays were done, reported separately.  Continue CAM walker, ice, elevation.  Return in three weeks.  X-rays on return.  Call if any problem.  Precautions discussed.    Electronically Signed Sanjuana Kava, MD 9/13/20182:57 PM

## 2016-11-04 ENCOUNTER — Other Ambulatory Visit: Payer: Medicare Other

## 2016-11-04 ENCOUNTER — Encounter: Payer: Medicare Other | Admitting: Orthopaedic Surgery

## 2016-11-08 ENCOUNTER — Other Ambulatory Visit: Payer: Medicare Other

## 2016-11-09 ENCOUNTER — Encounter: Payer: Self-pay | Admitting: Orthopaedic Surgery

## 2016-11-10 NOTE — Progress Notes (Signed)
This encounter was created in error - please disregard.

## 2016-11-17 ENCOUNTER — Ambulatory Visit (INDEPENDENT_AMBULATORY_CARE_PROVIDER_SITE_OTHER): Payer: Self-pay | Admitting: Orthopaedic Surgery

## 2016-11-17 ENCOUNTER — Ambulatory Visit (INDEPENDENT_AMBULATORY_CARE_PROVIDER_SITE_OTHER): Payer: Medicare Other

## 2016-11-17 DIAGNOSIS — S8261XD Displaced fracture of lateral malleolus of right fibula, subsequent encounter for closed fracture with routine healing: Secondary | ICD-10-CM

## 2016-11-17 DIAGNOSIS — S99191A Other physeal fracture of right metatarsal, initial encounter for closed fracture: Secondary | ICD-10-CM | POA: Diagnosis not present

## 2016-11-17 NOTE — Progress Notes (Signed)
CC:  My foot does not hurt now  She has been using the CAM walker.  She has no pain.  NV intact.  ROM of the right ankle is normal.  X-rays were done of the right ankle and foot, reported separately.  Encounter Diagnoses  Name Primary?  . Closed displaced fracture of lateral malleolus of right fibula with routine healing, subsequent encounter Yes  . Fracture of base of fifth metatarsal bone of right foot at metaphyseal-diaphyseal junction, closed, with routine healing, subsequent encounter     Return in one month  X-rays on return  Stop the CAM walker, use regular shoe.  Call if any problem.  Precautions discussed.   Electronically Signed Sanjuana Kava, MD 10/17/20189:22 AM

## 2016-12-15 ENCOUNTER — Ambulatory Visit: Payer: Medicare Other | Admitting: Orthopaedic Surgery

## 2017-06-30 ENCOUNTER — Encounter: Payer: Self-pay | Admitting: Gastroenterology

## 2017-08-30 ENCOUNTER — Encounter (INDEPENDENT_AMBULATORY_CARE_PROVIDER_SITE_OTHER): Payer: Self-pay

## 2017-08-30 ENCOUNTER — Encounter

## 2017-08-30 ENCOUNTER — Ambulatory Visit (INDEPENDENT_AMBULATORY_CARE_PROVIDER_SITE_OTHER): Payer: Medicare Other | Admitting: Gastroenterology

## 2017-08-30 ENCOUNTER — Encounter: Payer: Self-pay | Admitting: Gastroenterology

## 2017-08-30 VITALS — BP 110/60 | HR 104 | Ht 61.0 in | Wt 151.0 lb

## 2017-08-30 DIAGNOSIS — R142 Eructation: Secondary | ICD-10-CM

## 2017-08-30 DIAGNOSIS — R1310 Dysphagia, unspecified: Secondary | ICD-10-CM

## 2017-08-30 DIAGNOSIS — R1319 Other dysphagia: Secondary | ICD-10-CM

## 2017-08-30 DIAGNOSIS — R12 Heartburn: Secondary | ICD-10-CM | POA: Diagnosis not present

## 2017-08-30 NOTE — Patient Instructions (Addendum)
If you are age 64 or older, your body mass index should be between 23-30. Your Body mass index is 28.53 kg/m. If this is out of the aforementioned range listed, please consider follow up with your Primary Care Provider.  If you are age 42 or younger, your body mass index should be between 19-25. Your Body mass index is 28.53 kg/m. If this is out of the aformentioned range listed, please consider follow up with your Primary Care Provider.   You have been scheduled for an endoscopy. Please follow written instructions given to you at your visit today. If you use inhalers (even only as needed), please bring them with you on the day of your procedure. Your physician has requested that you go to www.startemmi.com and enter the access code given to you at your visit today. This web site gives a general overview about your procedure. However, you should still follow specific instructions given to you by our office regarding your preparation for the procedure.   It was a pleasure to see you today!  Dr. Loletha Carrow

## 2017-08-30 NOTE — Progress Notes (Signed)
[facility] Consult Note:  History: [name] [name] 08/30/2017  Referring physician: Glenda Chroman, MD  Reason for consult/chief complaint: Dysphagia (food coming back up as soon after eating); esophageal regurgitation (without warning); Abdominal Pain (generalized burning throught stomach); and Gas (exttreme belching)   Subjective  HPI:  This is a 62 year old woman referred by primary care in [place], [place] for long-standing GERD and dysphagia with regurgitation and belching.  She was seen for same symptoms by the GI practice in [place] in June 2013. Colonoscopy 2012 showed no colon polyps.  Upper endoscopy September 2012 normal except for reported small hiatal hernia, though not evident on photographs.  She has a reported history of hepatitis C, and she reports that it was treated, however details of that are not available.  She has a difficult and tangential historian, but reports many years of  Frequent belching, which she does multiple times during the visit.  She also has heartburn and regurgitation not completely controlled on both PPI and H2 blocker.  There was a time when she felt like perhaps the Dexilant was making things better, but now she feels she is "immune to it".  She describes a frequent sensation of food feeling stuck in the chest that she might have to bring her back on.  She is frustrated that despite many medicines she still has symptoms.  She also has chronic anxiety and chronic pain syndrome/fibromyalgia.  She is on clorazepate 2-3 times a day.  Artery describes previous endoscopic procedures in [place], including an upper endoscopy with Dr.Benson perhaps in the last few years, but there is no report available. ROS:  Review of Systems  Constitutional: Negative for appetite change and unexpected weight change.  HENT: Negative for mouth sores and voice change.   Eyes: Negative for pain and redness.  Respiratory: Negative for cough and shortness of  breath.   Cardiovascular: Negative for chest pain and palpitations.  Genitourinary: Negative for dysuria and hematuria.  Musculoskeletal: Positive for arthralgias and back pain. Negative for myalgias.  Skin: Negative for pallor and rash.  Neurological: Negative for weakness and headaches.  Hematological: Negative for adenopathy.  Psychiatric/Behavioral: Positive for dysphoric mood. The patient is nervous/anxious.      Past Medical History: Past Medical History:  Diagnosis Date  . Anxiety   . Complication of anesthesia   . Depression   . Diverticulosis   . Epilepsy ([place])   . Fecal incontinence   . Fibromyalgia   . Fractures   . GERD (gastroesophageal reflux disease)    Last EGD with 66F dilation by Dr. Gala Romney 07/29/2006  . Hemorrhoids 10/2004   Otherwise normal colonoscopy by Dr. Gala Romney  . Hepatitis C antibody test positive    confirmed positive ab during 07/2011 hospitalization  . Hiatal hernia   . Osteoarthritis   . Osteopenia   . Peptic stricture of esophagus    2006  . Polysubstance abuse ([place])    positive UDS for cocaine, etoh abuse  . PONV (postoperative nausea and vomiting)   . Rheumatoid arthritis(714.0)   . Schatzki's ring    multiple dilations  . Seasonal allergies   . Seizures ([place])      Past Surgical History: Past Surgical History:  Procedure Laterality Date  . APPENDECTOMY    . BACK SURGERY    . COLONOSCOPY  10/08/10   NIO:[id number] diverticulosis otherwise normal colon and rectum  . ESOPHAGOGASTRODUODENOSCOPY  10/08/10   HWE:[id number] hiatal hernia otherwise normal  . EYE SURGERY  right  . LAPAROSCOPIC HYSTERECTOMY    . NOSE SURGERY    . RECTOCELE REPAIR    . S/P Hysterectomy    . TUBAL LIGATION       Family History: Family History  Problem Relation Age of Onset  . Leukemia Father   . Alzheimer's disease Mother   . Arthritis Mother   . Arthritis Sister   . Diabetes Sister   . Coronary artery disease Sister   . Anesthesia problems Neg Hx   .  Hypotension Neg Hx   . Malignant hyperthermia Neg Hx   . Pseudochol deficiency Neg Hx   . Colon cancer Neg Hx     Social History: Social History   Socioeconomic History  . Marital status: Married    Spouse name: Not on file  . Number of children: 1  . Years of education: Not on file  . Highest education level: Not on file  Occupational History  . Occupation: disability    Employer: NOT EMPLOYED  Social Needs  . Financial resource strain: Not on file  . Food insecurity:    Worry: Not on file    Inability: Not on file  . Transportation needs:    Medical: Not on file    Non-medical: Not on file  Tobacco Use  . Smoking status: Former Smoker    Packs/day: 0.50    Types: Cigarettes  . Smokeless tobacco: Never Used  . Tobacco comment: quit as a teenager  Substance and Sexual Activity  . Alcohol use: Yes    Comment: 2-4 beers per month. Last beer on Sunday.   . Drug use: No    Comment: h/o cocaine in past; none since June   . Sexual activity: Not on file  Lifestyle  . Physical activity:    Days per week: Not on file    Minutes per session: Not on file  . Stress: Not on file  Relationships  . Social connections:    Talks on phone: Not on file    Gets together: Not on file    Attends religious service: Not on file    Active member of club or organization: Not on file    Attends meetings of clubs or organizations: Not on file    Relationship status: Not on file  Other Topics Concern  . Not on file  Social History Narrative  . Not on file    Allergies: Allergies  Allergen Reactions  . Morphine And Related   . Penicillins Other (See Comments)    Turned purple with Penicillin shot when younger  . Sulfonamide Derivatives     REACTION: UNKNOWN REACTION    Outpatient Meds: Current Outpatient Medications  Medication Sig Dispense Refill  . Ascorbic Acid (VITAMIN C) 1000 MG tablet Take 1,000 mg by mouth as needed.     . Calcium Carbonate Antacid (TUMS E-X PO) Take  by mouth as needed.    . cetirizine (ZYRTEC) 10 MG tablet Take 1 tablet by mouth daily.  2  . chlordiazePOXIDE (LIBRIUM) 5 MG capsule Take 1 capsule by mouth 3 (three) times daily.  3  . dexlansoprazole (DEXILANT) 60 MG capsule Take 1 capsule (60 mg total) by mouth daily. 31 capsule 3  . FLUoxetine (PROZAC) 20 MG capsule Take 1 capsule by mouth daily.    Marland Kitchen oxybutynin (DITROPAN-XL) 10 MG 24 hr tablet Take 1 tablet by mouth daily.    . primidone (MYSOLINE) 250 MG tablet Take 250 mg by mouth 3 x daily with  food.      . ranitidine (ZANTAC) 150 MG tablet Take 150-300 mg by mouth at bedtime.    . traMADol (ULTRAM) 50 MG tablet Take 1 tablet by mouth 3 (three) times daily as needed.  0   No current facility-administered medications for this visit.       ___________________________________________________________________ Objective   Exam:  BP 110/60 (BP Location: Left Arm, Patient Position: Sitting, Cuff Size: Normal)   Pulse (!) 104   Ht 5\' 1"  (1.549 m) Comment: height measured without shoes  Wt 151 lb (68.5 kg)   BMI 28.53 kg/m    General: this is a(n) pleasant woman, well-appearing, accompanied by mother.  She is very animated has a tangential thought pattern.  He belches frequently during the exam.  Eyes: sclera anicteric, no redness  ENT: oral mucosa moist without lesions, no cervical or supraclavicular lymphadenopathy, good dentition  CV: RRR without murmur, S1/S2, no JVD, no peripheral edema  Resp: clear to auscultation bilaterally, normal RR and effort noted  GI: soft, epigastric tenderness, with active bowel sounds. No guarding or palpable organomegaly noted.  Skin; warm and dry, no rash or jaundice noted  Neuro: awake, alert and oriented x 3. Normal gross motor function and fluent speech  Labs:  Previous reports as noted above  Assessment: Encounter Diagnoses  Name Primary?  . Esophageal dysphagia Yes  . Belching   . Heartburn     Patient seems to have reflux  but also long-standing symptoms of what sounds like perhaps a motility disorder as well as frequent belching from aerophagia.  Back there is a motility and also functional component to bit of anxiety.  Previous testing results are incomplete and uncertain.  Plan:  Upper endoscopy.  She is agreeable after discussion of procedure and risks.  The benefits and risks of the planned procedure were described in detail with the patient or (when appropriate) their health care proxy.  Risks were outlined as including, but not limited to, bleeding, infection, perforation, adverse medication reaction leading to cardiac or pulmonary decompensation, or pancreatitis (if ERCP).  The limitation of incomplete mucosal visualization was also discussed.  No guarantees or warranties were given.  Depending on results, may need esophageal motility study.  Thank you for the courtesy of this consult.  Please call me with any questions or concerns.  Nelida Meuse III  CC: Glenda Chroman, MD

## 2017-08-31 ENCOUNTER — Other Ambulatory Visit: Payer: Self-pay

## 2017-08-31 DIAGNOSIS — R142 Eructation: Secondary | ICD-10-CM

## 2017-08-31 DIAGNOSIS — R1310 Dysphagia, unspecified: Secondary | ICD-10-CM

## 2017-08-31 DIAGNOSIS — R12 Heartburn: Secondary | ICD-10-CM

## 2017-08-31 DIAGNOSIS — R1319 Other dysphagia: Secondary | ICD-10-CM

## 2017-09-05 DIAGNOSIS — Z6827 Body mass index (BMI) 27.0-27.9, adult: Secondary | ICD-10-CM | POA: Diagnosis not present

## 2017-09-05 DIAGNOSIS — R569 Unspecified convulsions: Secondary | ICD-10-CM | POA: Diagnosis not present

## 2017-09-05 DIAGNOSIS — Z79899 Other long term (current) drug therapy: Secondary | ICD-10-CM | POA: Diagnosis not present

## 2017-09-05 DIAGNOSIS — M797 Fibromyalgia: Secondary | ICD-10-CM | POA: Diagnosis not present

## 2017-09-05 DIAGNOSIS — F419 Anxiety disorder, unspecified: Secondary | ICD-10-CM | POA: Diagnosis not present

## 2017-09-05 DIAGNOSIS — Z299 Encounter for prophylactic measures, unspecified: Secondary | ICD-10-CM | POA: Diagnosis not present

## 2017-09-05 DIAGNOSIS — B182 Chronic viral hepatitis C: Secondary | ICD-10-CM | POA: Diagnosis not present

## 2017-09-07 ENCOUNTER — Encounter: Payer: Self-pay | Admitting: Gastroenterology

## 2017-09-14 ENCOUNTER — Encounter: Payer: Medicare Other | Admitting: Gastroenterology

## 2017-09-14 ENCOUNTER — Telehealth: Payer: Self-pay | Admitting: Gastroenterology

## 2017-09-14 NOTE — Telephone Encounter (Signed)
Please call her this week to reschedule.

## 2017-09-15 NOTE — Telephone Encounter (Signed)
Left message to return call 

## 2017-09-21 DIAGNOSIS — M159 Polyosteoarthritis, unspecified: Secondary | ICD-10-CM | POA: Diagnosis not present

## 2017-09-21 DIAGNOSIS — F329 Major depressive disorder, single episode, unspecified: Secondary | ICD-10-CM | POA: Diagnosis not present

## 2017-09-21 DIAGNOSIS — M069 Rheumatoid arthritis, unspecified: Secondary | ICD-10-CM | POA: Diagnosis not present

## 2017-09-26 ENCOUNTER — Ambulatory Visit (AMBULATORY_SURGERY_CENTER): Payer: Medicare HMO | Admitting: Gastroenterology

## 2017-09-26 ENCOUNTER — Encounter: Payer: Self-pay | Admitting: Gastroenterology

## 2017-09-26 VITALS — BP 143/74 | HR 90 | Temp 98.6°F | Resp 17 | Ht 61.0 in | Wt 151.0 lb

## 2017-09-26 DIAGNOSIS — K2100 Gastro-esophageal reflux disease with esophagitis, without bleeding: Secondary | ICD-10-CM

## 2017-09-26 DIAGNOSIS — R1310 Dysphagia, unspecified: Secondary | ICD-10-CM | POA: Diagnosis not present

## 2017-09-26 DIAGNOSIS — K222 Esophageal obstruction: Secondary | ICD-10-CM

## 2017-09-26 DIAGNOSIS — K209 Esophagitis, unspecified: Secondary | ICD-10-CM | POA: Diagnosis not present

## 2017-09-26 DIAGNOSIS — K2210 Ulcer of esophagus without bleeding: Secondary | ICD-10-CM | POA: Diagnosis not present

## 2017-09-26 DIAGNOSIS — R1319 Other dysphagia: Secondary | ICD-10-CM

## 2017-09-26 MED ORDER — SODIUM CHLORIDE 0.9 % IV SOLN: 500.0000 mL | Freq: Once | INTRAVENOUS | Status: DC

## 2017-09-26 NOTE — Progress Notes (Signed)
Called to room to assist during endoscopic procedure.  Patient ID and intended procedure confirmed with present staff. Received instructions for my participation in the procedure from the performing physician.  

## 2017-09-26 NOTE — Progress Notes (Signed)
Pt's states no medical or surgical changes since previsit or office visit. 

## 2017-09-26 NOTE — Progress Notes (Signed)
A and O x3. Report to RN. Tolerated MAC anesthesia well.Teeth unchanged after procedure.

## 2017-09-26 NOTE — Op Note (Signed)
[facility] Patient Name: [name] [name] Procedure Date: 09/26/2017 10:03 AM MRN: [id number] Endoscopist: [place]. Loletha Carrow , MD Age: 62 Referring MD:  Date of Birth: 1955/07/13 Gender: Female Account #: [id number] Procedure:                Upper GI endoscopy Indications:              Esophageal dysphagia, Heartburn, Eructation Medicines:                Monitored Anesthesia Care Procedure:                Pre-Anesthesia Assessment:                           - Prior to the procedure, a History and Physical                            was performed, and patient medications and                            allergies were reviewed. The patient's tolerance of                            previous anesthesia was also reviewed. The risks                            and benefits of the procedure and the sedation                            options and risks were discussed with the patient.                            All questions were answered, and informed consent                            was obtained. Prior Anticoagulants: The patient has                            taken no previous anticoagulant or antiplatelet                            agents. ASA Grade Assessment: II - A patient with                            mild systemic disease. After reviewing the risks                            and benefits, the patient was deemed in                            satisfactory condition to undergo the procedure.                           After obtaining informed consent, the endoscope was  passed under direct vision. Throughout the                            procedure, the patient's blood pressure, pulse, and                            oxygen saturations were monitored continuously. The                            Endoscope was introduced through the mouth, and                            advanced to the second part of duodenum. The upper                            GI  endoscopy was accomplished without difficulty.                            The patient tolerated the procedure well. Scope In: Scope Out: Findings:                 An 8-10 cm hiatal hernia was present, with EGJ                            about 27cm from incisors.                           LA Grade D (one or more mucosal breaks involving at                            least 75% of esophageal circumference) esophagitis                            was found in the distal esophagus. Biopsies were                            obtained from the proximal and distal esophagus                            with cold forceps for histology of suspected                            eosinophilic esophagitis due to linear mucosal                            furrowing.                           One moderate stenosis was found at the                            gastroesophageal junction. This stenosis measured 9                            mm (inner diameter)  x 1 cm (in length). The                            stenosis was traversed with mild resistance and                            slow, gentle scope pressure. A TTS dilator was                            passed through the scope. Dilation with a                            13.5-14.5-15.5 mm balloon dilator was performed to                            14.5 mm. The dilation site was examined and showed                            moderate improvement in luminal narrowing. Due to                            the degree of esophagitis, it could not be                            determined with certainty if Barrett's was present.                           The stomach was normal.                           The cardia and gastric fundus were normal on                            retroflexion.                           The examined duodenum was normal. Complications:            No immediate complications. Estimated Blood Loss:     Estimated blood loss was minimal. Impression:                - 8-10 cm hiatal hernia.                           - LA Grade D reflux esophagitis. Biopsied.                           - Esophageal stenosis. Dilated.                           - Normal stomach.                           - Normal examined duodenum. Recommendation:           - Patient has a contact number available for  emergencies. The signs and symptoms of potential                            delayed complications were discussed with the                            patient. Return to normal activities tomorrow.                            Written discharge instructions were provided to the                            patient.                           - Soft diet.                           - Continue present medications, however change                            evening ranitidine to omeprazole 20 mg (available                            over the counter).                           - Await pathology results.                           - Repeat upper endoscopy after studies are complete                            to check healing and perform repeat dilation as                            needed.  L. Loletha Carrow, MD 09/26/2017 10:43:00 AM This report has been signed electronically.

## 2017-09-26 NOTE — Patient Instructions (Addendum)
YOU HAD AN ENDOSCOPIC PROCEDURE TODAY AT [facility]:   Refer to the procedure report that was given to you for any specific questions about what was found during the examination.  If the procedure report does not answer your questions, please call your gastroenterologist to clarify.  If you requested that your care partner not be given the details of your procedure findings, then the procedure report has been included in a sealed envelope for you to review at your convenience later.  YOU SHOULD EXPECT: Some feelings of bloating in the abdomen. Passage of more gas than usual.  Walking can help get rid of the air that was put into your GI tract during the procedure and reduce the bloating. If you had a lower endoscopy (such as a colonoscopy or flexible sigmoidoscopy) you may notice spotting of blood in your stool or on the toilet paper. If you underwent a bowel prep for your procedure, you may not have a normal bowel movement for a few days.  Please Note:  You might notice some irritation and congestion in your nose or some drainage.  This is from the oxygen used during your procedure.  There is no need for concern and it should clear up in a day or so.  SYMPTOMS TO REPORT IMMEDIATELY:   Following upper endoscopy (EGD)  Vomiting of blood or coffee ground material  New chest pain or pain under the shoulder blades  Painful or persistently difficult swallowing  New shortness of breath  Fever of 100F or higher  Black, tarry-looking stools  For urgent or emergent issues, a gastroenterologist can be reached at any hour by calling [phone].   DIET:  We do recommend you follow post esophageal dilation diet.  Drink plenty of fluids but you should avoid alcoholic beverages for 24 hours.  ACTIVITY:  You should plan to take it easy for the rest of today and you should NOT DRIVE or use heavy machinery until tomorrow (because of the sedation medicines used during the test).    FOLLOW  UP: Our staff will call the number listed on your records the next business day following your procedure to check on you and address any questions or concerns that you may have regarding the information given to you following your procedure. If we do not reach you, we will leave a message.  However, if you are feeling well and you are not experiencing any problems, there is no need to return our call.  We will assume that you have returned to your regular daily activities without incident.  If any biopsies were taken you will be contacted by phone or by letter within the next 1-3 weeks.  Please call us at [phone] if you have not heard about the biopsies in 3 weeks.  Continue present medications, however change evening ranitidine to omeprazole 20 mg (available over the counter Await for biopsy results  SIGNATURES/CONFIDENTIALITY: You and/or your care partner have signed paperwork which will be entered into your electronic medical record.  These signatures attest to the fact that that the information above on your After Visit Summary has been reviewed and is understood.  Full responsibility of the confidentiality of this discharge information lies with you and/or your care-partner.

## 2017-09-27 ENCOUNTER — Telehealth: Payer: Self-pay | Admitting: *Deleted

## 2017-09-27 NOTE — Telephone Encounter (Signed)
Unable to leave message,mailbox full. 

## 2017-09-27 NOTE — Telephone Encounter (Signed)
It is most likely reflux and esophageal spasm, but it could be cardiac.  She should go immediately to the nearest emergency room, which is probably [facility] since she lives in [place].

## 2017-09-27 NOTE — Telephone Encounter (Signed)
  Follow up Call-  Call back number 09/26/2017  Post procedure Call Back phone  # [phone]  Permission to leave phone message Yes  Some recent data might be hidden     Patient questions:  Do you have a fever, pain , or abdominal swelling? Yes.   Pain Score  7 *  Have you tolerated food without any problems? No.  Have you been able to return to your normal activities? No.  Do you have any questions about your discharge instructions: Diet   No. Medications  No. Follow up visit  No.  Do you have questions or concerns about your Care? Yes.    Actions: * If pain score is 4 or above: Physician/ provider Notified : [name] Meuse, MD   Pt. States she was sipping iced-tea this AM and her "throat locked up". She states it felt like an elephant was sitting on her chest.  States it was at least 10 on pain scale.  She is still having pain level of 7.  She ate eggs and cheese last night without incidence. Please advise .

## 2017-09-27 NOTE — Telephone Encounter (Signed)
Advised pt. That she should go to nearest ED now,per Dr. Loletha Carrow.  I related that Dr. Loletha Carrow said this could be a spasm or could be cardiac.  She states that discomfort has "eased up" and she will not go to ED at this time.[name] [name]

## 2017-09-28 NOTE — Telephone Encounter (Signed)
Scope was completed on 09-26-2017.

## 2017-10-04 DIAGNOSIS — F419 Anxiety disorder, unspecified: Secondary | ICD-10-CM | POA: Diagnosis not present

## 2017-10-04 DIAGNOSIS — Z299 Encounter for prophylactic measures, unspecified: Secondary | ICD-10-CM | POA: Diagnosis not present

## 2017-10-04 DIAGNOSIS — K219 Gastro-esophageal reflux disease without esophagitis: Secondary | ICD-10-CM | POA: Diagnosis not present

## 2017-10-04 DIAGNOSIS — R569 Unspecified convulsions: Secondary | ICD-10-CM | POA: Diagnosis not present

## 2017-10-04 DIAGNOSIS — Z6826 Body mass index (BMI) 26.0-26.9, adult: Secondary | ICD-10-CM | POA: Diagnosis not present

## 2017-10-04 DIAGNOSIS — M47812 Spondylosis without myelopathy or radiculopathy, cervical region: Secondary | ICD-10-CM | POA: Diagnosis not present

## 2017-10-04 DIAGNOSIS — Z79899 Other long term (current) drug therapy: Secondary | ICD-10-CM | POA: Diagnosis not present

## 2017-10-20 ENCOUNTER — Ambulatory Visit (AMBULATORY_SURGERY_CENTER): Payer: Self-pay

## 2017-10-20 ENCOUNTER — Telehealth: Payer: Self-pay | Admitting: Gastroenterology

## 2017-10-20 VITALS — Ht 62.5 in | Wt 152.6 lb

## 2017-10-20 DIAGNOSIS — R1310 Dysphagia, unspecified: Secondary | ICD-10-CM

## 2017-10-20 NOTE — Telephone Encounter (Signed)
Spoke to patient she has had increased difficulty swallowing since last Friday (9/13). Yesterday she was unable to swallow even water for 8 hours. Today is better, able to swallow. She is still doing more of a liquid/soft diet. EGD with dilation is on 11/10/17.

## 2017-10-20 NOTE — Telephone Encounter (Signed)
She needs to be on the antacid medicine regimen I prescribed for enough time that it helps heal the esophagus before another dilation can be done.   Keep appt for 10/10.  Soft diet in the meantime.

## 2017-10-20 NOTE — Progress Notes (Signed)
Denies allergies to eggs or soy products. Denies complication of anesthesia or sedation. Denies use of weight loss medication. Denies use of O2.   Emmi instructions declined.  

## 2017-10-21 NOTE — Telephone Encounter (Signed)
Spoke to patient with Dr. Loletha Carrow' recommendations. She will see [place] on 10/10.

## 2017-10-26 DIAGNOSIS — M159 Polyosteoarthritis, unspecified: Secondary | ICD-10-CM | POA: Diagnosis not present

## 2017-10-26 DIAGNOSIS — F329 Major depressive disorder, single episode, unspecified: Secondary | ICD-10-CM | POA: Diagnosis not present

## 2017-10-26 DIAGNOSIS — M069 Rheumatoid arthritis, unspecified: Secondary | ICD-10-CM | POA: Diagnosis not present

## 2017-10-27 ENCOUNTER — Encounter: Payer: Self-pay | Admitting: Gastroenterology

## 2017-10-28 DIAGNOSIS — M1710 Unilateral primary osteoarthritis, unspecified knee: Secondary | ICD-10-CM | POA: Diagnosis not present

## 2017-10-28 DIAGNOSIS — Z6826 Body mass index (BMI) 26.0-26.9, adult: Secondary | ICD-10-CM | POA: Diagnosis not present

## 2017-10-28 DIAGNOSIS — M1990 Unspecified osteoarthritis, unspecified site: Secondary | ICD-10-CM | POA: Diagnosis not present

## 2017-10-28 DIAGNOSIS — R569 Unspecified convulsions: Secondary | ICD-10-CM | POA: Diagnosis not present

## 2017-10-28 DIAGNOSIS — Z299 Encounter for prophylactic measures, unspecified: Secondary | ICD-10-CM | POA: Diagnosis not present

## 2017-10-28 DIAGNOSIS — F419 Anxiety disorder, unspecified: Secondary | ICD-10-CM | POA: Diagnosis not present

## 2017-11-10 ENCOUNTER — Ambulatory Visit (AMBULATORY_SURGERY_CENTER): Payer: Medicare HMO | Admitting: Gastroenterology

## 2017-11-10 ENCOUNTER — Telehealth: Payer: Self-pay | Admitting: Gastroenterology

## 2017-11-10 ENCOUNTER — Encounter: Payer: Self-pay | Admitting: Gastroenterology

## 2017-11-10 VITALS — BP 168/88 | HR 90 | Temp 98.6°F | Resp 12 | Ht 62.5 in | Wt 151.0 lb

## 2017-11-10 DIAGNOSIS — K222 Esophageal obstruction: Secondary | ICD-10-CM

## 2017-11-10 DIAGNOSIS — K2100 Gastro-esophageal reflux disease with esophagitis, without bleeding: Secondary | ICD-10-CM

## 2017-11-10 DIAGNOSIS — R1310 Dysphagia, unspecified: Secondary | ICD-10-CM | POA: Diagnosis not present

## 2017-11-10 DIAGNOSIS — M069 Rheumatoid arthritis, unspecified: Secondary | ICD-10-CM | POA: Diagnosis not present

## 2017-11-10 DIAGNOSIS — R569 Unspecified convulsions: Secondary | ICD-10-CM | POA: Diagnosis not present

## 2017-11-10 DIAGNOSIS — R1319 Other dysphagia: Secondary | ICD-10-CM

## 2017-11-10 DIAGNOSIS — K449 Diaphragmatic hernia without obstruction or gangrene: Secondary | ICD-10-CM

## 2017-11-10 MED ORDER — SODIUM CHLORIDE 0.9 % IV SOLN: 500.0000 mL | Freq: Once | INTRAVENOUS | Status: DC

## 2017-11-10 NOTE — Op Note (Addendum)
[place] Patient Name: [name] [name] Procedure Date: 11/10/2017 10:04 AM MRN: [id number] Endoscopist: [place]. Loletha Carrow , MD Age: 62 Referring MD:  Date of Birth: [birth year] Gender: Female Account #: [id number] Procedure:                Upper GI endoscopy Indications:              Dysphagia, Reflux esophagitis, For therapy of                            esophageal stricture (last EGD 09/26/17 with severe                            reflux esophagitis and stricture that underwent                            dilation - patient has been on maximal acid                            suppression) Medicines:                Monitored Anesthesia Care Procedure:                Pre-Anesthesia Assessment:                           - Prior to the procedure, a History and Physical                            was performed, and patient medications and                            allergies were reviewed. The patient's tolerance of                            previous anesthesia was also reviewed. The risks                            and benefits of the procedure and the sedation                            options and risks were discussed with the patient.                            All questions were answered, and informed consent                            was obtained. Prior Anticoagulants: The patient has                            taken no previous anticoagulant or antiplatelet                            agents. ASA Grade Assessment: III - A patient with  severe systemic disease. After reviewing the risks                            and benefits, the patient was deemed in                            satisfactory condition to undergo the procedure.                           After obtaining informed consent, the endoscope was                            passed under direct vision. Throughout the                            procedure, the patient's blood pressure, pulse,  and                            oxygen saturations were monitored continuously. The                            Model GIF-HQ190 [id number]) scope was introduced                            through the mouth, and advanced to the second part                            of duodenum. The upper GI endoscopy was                            accomplished without difficulty. The patient                            tolerated the procedure well. Scope In: Scope Out: Findings:                 LA Grade D (one or more mucosal breaks involving at                            least 75% of esophageal circumference) esophagitis                            with no bleeding was found in the distal esophagus.                           One benign-appearing, intrinsic moderate stenosis                            was found in the distal esophagus. This stenosis                            measured 9 mm (inner diameter - scope able to pass  with mild resistance) x 2 cm (in length). The                            stenosis was traversed. A TTS dilator was passed                            through the scope. Dilation with a 13.5-14.5-15.5                            mm balloon dilator was performed to 15.5 mm. The                            dilation site was examined and showed moderate                            mucosal disruption and moderate improvement in                            luminal narrowing.                           An 8-10 cm hiatal hernia was present.                           Striped erythematous mucosa was found in the                            gastric antrum.                           The exam of the stomach was otherwise normal.                           The examined duodenum was normal. Complications:            No immediate complications. Estimated Blood Loss:     Estimated blood loss was minimal. Impression:               - LA Grade D reflux esophagitis. Not improved from                             recent EGD while on maximal acid suppression.                           - Benign-appearing esophageal stenosis. Dilated.                           - 8-10 cm hiatal hernia.                           - Erythematous mucosa in the antrum.                           - Normal examined duodenum.                           -  No specimens collected. Recommendation:           - Patient has a contact number available for                            emergencies. The signs and symptoms of potential                            delayed complications were discussed with the                            patient. Return to normal activities tomorrow.                            Written discharge instructions were provided to the                            patient.                           - Resume soft diet.                           - Continue present medications.                           - Follow an antireflux regimen indefinitely.                           - Schedule gastric emptying study, esophageal                            manometry, and refer to general surgery for                            consideration of hiatal hernia                            repair/fundoplication. Clio Gerhart L. Loletha Carrow, MD 11/10/2017 10:27:50 AM This report has been signed electronically.

## 2017-11-10 NOTE — Telephone Encounter (Signed)
Please arrange the following for this patient's GERD and dysphagia:  Gastric emptying study Esophageal manometry Referral to surgery for severe GERD with hiatal hernia - evaluate for fundoplication  (Drs. Alfonzo Beers)

## 2017-11-10 NOTE — Progress Notes (Signed)
Called to room to assist during endoscopic procedure.  Patient ID and intended procedure confirmed with present staff. Received instructions for my participation in the procedure from the performing physician.  

## 2017-11-10 NOTE — Progress Notes (Signed)
Report given to PACU, vss 

## 2017-11-10 NOTE — Patient Instructions (Signed)
YOU HAD AN ENDOSCOPIC PROCEDURE TODAY AT [facility]:   Refer to the procedure report that was given to you for any specific questions about what was found during the examination.  If the procedure report does not answer your questions, please call your gastroenterologist to clarify.  If you requested that your care partner not be given the details of your procedure findings, then the procedure report has been included in a sealed envelope for you to review at your convenience later.  YOU SHOULD EXPECT: Some feelings of bloating in the abdomen. Passage of more gas than usual.  Walking can help get rid of the air that was put into your GI tract during the procedure and reduce the bloating.   Please Note:  You might notice some irritation and congestion in your nose or some drainage.  This is from the oxygen used during your procedure.  There is no need for concern and it should clear up in a day or so.  SYMPTOMS TO REPORT IMMEDIATELY:    Following upper endoscopy (EGD)  Vomiting of blood or coffee ground material  New chest pain or pain under the shoulder blades  Painful or persistently difficult swallowing  New shortness of breath  Fever of 100F or higher  Black, tarry-looking stools  For urgent or emergent issues, a gastroenterologist can be reached at any hour by calling [phone].   DIET:  We do recommend  clear liquids for 1 hour, and a soft diet until further notice.  Drink plenty of fluids but you should avoid alcoholic beverages for 24 hours.  ACTIVITY:  You should plan to take it easy for the rest of today and you should NOT DRIVE or use heavy machinery until tomorrow (because of the sedation medicines used during the test).    FOLLOW UP: Our staff will call the number listed on your records the next business day following your procedure to check on you and address any questions or concerns that you may have regarding the information given to you following  your procedure. If we do not reach you, we will leave a message.  However, if you are feeling well and you are not experiencing any problems, there is no need to return our call.  We will assume that you have returned to your regular daily activities without incident.  The tests ordered will be scheduled by the 3rd floor staff.  They will call you.   SIGNATURES/CONFIDENTIALITY: You and/or your care partner have signed paperwork which will be entered into your electronic medical record.  These signatures attest to the fact that that the information above on your After Visit Summary has been reviewed and is understood.  Full responsibility of the confidentiality of this discharge information lies with you and/or your care-partner.

## 2017-11-10 NOTE — Progress Notes (Signed)
Pt's states no medical or surgical changes since previsit or office visit. 

## 2017-11-11 ENCOUNTER — Other Ambulatory Visit: Payer: Self-pay

## 2017-11-11 ENCOUNTER — Telehealth: Payer: Self-pay

## 2017-11-11 DIAGNOSIS — K449 Diaphragmatic hernia without obstruction or gangrene: Secondary | ICD-10-CM

## 2017-11-11 DIAGNOSIS — R1319 Other dysphagia: Secondary | ICD-10-CM

## 2017-11-11 DIAGNOSIS — R1310 Dysphagia, unspecified: Secondary | ICD-10-CM

## 2017-11-11 DIAGNOSIS — K2100 Gastro-esophageal reflux disease with esophagitis, without bleeding: Secondary | ICD-10-CM

## 2017-11-11 NOTE — Telephone Encounter (Signed)
  Follow up Call-  Call back number 11/10/2017 09/26/2017  Post procedure Call Back phone  # [phone]  Permission to leave phone message No Yes  Some recent data might be hidden     Patient questions:  Do you have a fever, pain , or abdominal swelling? No. Pain Score  Some soreness in chestPt. Is sore.  Pt. Reports she was sore after her last dilation.   *  Have you tolerated food without any problems? Yes.    Have you been able to return to your normal activities? Yes.    Do you have any questions about your discharge instructions: Diet   No. Medications  No. Follow up visit  No.  Do you have questions or concerns about your Care? No.  Actions:  Told pt. To call if she has any further questions or concerns.   * If pain score is 4 or above: No action needed, pain <4.

## 2017-11-11 NOTE — Telephone Encounter (Signed)
Patient contacted and scheduled for [facility] on 10/25 and EM on 10/30, mailed prep instructions to patient. Faxed referral to [facility], also in [place].

## 2017-11-15 DIAGNOSIS — F419 Anxiety disorder, unspecified: Secondary | ICD-10-CM | POA: Diagnosis not present

## 2017-11-15 DIAGNOSIS — Z299 Encounter for prophylactic measures, unspecified: Secondary | ICD-10-CM | POA: Diagnosis not present

## 2017-11-15 DIAGNOSIS — R51 Headache: Secondary | ICD-10-CM | POA: Diagnosis not present

## 2017-11-15 DIAGNOSIS — R569 Unspecified convulsions: Secondary | ICD-10-CM | POA: Diagnosis not present

## 2017-11-15 DIAGNOSIS — K449 Diaphragmatic hernia without obstruction or gangrene: Secondary | ICD-10-CM | POA: Diagnosis not present

## 2017-11-23 DIAGNOSIS — F329 Major depressive disorder, single episode, unspecified: Secondary | ICD-10-CM | POA: Diagnosis not present

## 2017-11-23 DIAGNOSIS — M069 Rheumatoid arthritis, unspecified: Secondary | ICD-10-CM | POA: Diagnosis not present

## 2017-11-23 DIAGNOSIS — M159 Polyosteoarthritis, unspecified: Secondary | ICD-10-CM | POA: Diagnosis not present

## 2017-11-25 ENCOUNTER — Ambulatory Visit (HOSPITAL_COMMUNITY)
Admission: RE | Admit: 2017-11-25 | Discharge: 2017-11-25 | Disposition: A | Discharge status: Home / Self Care | Payer: Medicare HMO | Source: Ambulatory Visit | Attending: Gastroenterology | Admitting: Gastroenterology

## 2017-11-25 DIAGNOSIS — R1310 Dysphagia, unspecified: Secondary | ICD-10-CM

## 2017-11-25 DIAGNOSIS — K219 Gastro-esophageal reflux disease without esophagitis: Secondary | ICD-10-CM | POA: Diagnosis not present

## 2017-11-25 DIAGNOSIS — K449 Diaphragmatic hernia without obstruction or gangrene: Secondary | ICD-10-CM | POA: Diagnosis not present

## 2017-11-25 DIAGNOSIS — R1319 Other dysphagia: Secondary | ICD-10-CM

## 2017-11-25 MED ORDER — TECHNETIUM TC 99M SULFUR COLLOID: 2.0000 | Freq: Once | INTRAVENOUS | Status: DC | PRN

## 2017-11-30 ENCOUNTER — Encounter (HOSPITAL_COMMUNITY)
Admission: RE | Disposition: A | Discharge status: Home / Self Care | Payer: Self-pay | Source: Ambulatory Visit | Attending: Gastroenterology

## 2017-11-30 ENCOUNTER — Ambulatory Visit (HOSPITAL_COMMUNITY)
Admission: RE | Admit: 2017-11-30 | Discharge: 2017-11-30 | Disposition: A | Discharge status: Home / Self Care | Payer: Medicare HMO | Source: Ambulatory Visit | Attending: Gastroenterology | Admitting: Gastroenterology

## 2017-11-30 DIAGNOSIS — K210 Gastro-esophageal reflux disease with esophagitis: Secondary | ICD-10-CM | POA: Diagnosis not present

## 2017-11-30 DIAGNOSIS — R1310 Dysphagia, unspecified: Secondary | ICD-10-CM

## 2017-11-30 DIAGNOSIS — K449 Diaphragmatic hernia without obstruction or gangrene: Secondary | ICD-10-CM | POA: Diagnosis not present

## 2017-11-30 HISTORY — PX: ESOPHAGEAL MANOMETRY: SHX5429

## 2017-11-30 SURGERY — MANOMETRY, ESOPHAGUS
Anesthesia: Topical

## 2017-11-30 MED ORDER — LIDOCAINE VISCOUS HCL 2 % MT SOLN: 15.0000 mL | Freq: Once | OROMUCOSAL | Status: DC

## 2017-11-30 MED ORDER — LIDOCAINE VISCOUS HCL 2 % MT SOLN
OROMUCOSAL | Status: AC
  Filled 2017-11-30: qty 15

## 2017-11-30 SURGICAL SUPPLY — 2 items
FACESHIELD LNG OPTICON STERILE (SAFETY) IMPLANT
GLOVE BIO SURGEON STRL SZ8 (GLOVE) ×6 IMPLANT

## 2017-11-30 NOTE — Progress Notes (Signed)
Esophageal manometry performed per protocol.  Patient tolerated procedure without complications.  Dr. Nandigam to interpret results. 

## 2017-12-01 ENCOUNTER — Encounter (HOSPITAL_COMMUNITY): Payer: Self-pay | Admitting: Gastroenterology

## 2017-12-02 DIAGNOSIS — R569 Unspecified convulsions: Secondary | ICD-10-CM | POA: Diagnosis not present

## 2017-12-02 DIAGNOSIS — Z6826 Body mass index (BMI) 26.0-26.9, adult: Secondary | ICD-10-CM | POA: Diagnosis not present

## 2017-12-02 DIAGNOSIS — Z299 Encounter for prophylactic measures, unspecified: Secondary | ICD-10-CM | POA: Diagnosis not present

## 2017-12-02 DIAGNOSIS — F419 Anxiety disorder, unspecified: Secondary | ICD-10-CM | POA: Diagnosis not present

## 2017-12-02 DIAGNOSIS — M1710 Unilateral primary osteoarthritis, unspecified knee: Secondary | ICD-10-CM | POA: Diagnosis not present

## 2017-12-06 ENCOUNTER — Ambulatory Visit: Payer: Self-pay | Admitting: Surgery

## 2017-12-06 DIAGNOSIS — K449 Diaphragmatic hernia without obstruction or gangrene: Secondary | ICD-10-CM | POA: Diagnosis not present

## 2017-12-08 ENCOUNTER — Ambulatory Visit: Payer: Self-pay | Admitting: General Surgery

## 2017-12-08 ENCOUNTER — Ambulatory Visit: Payer: Self-pay | Admitting: Surgery

## 2017-12-08 NOTE — H&P (Signed)
[name] [name] Documented: 12/06/2017 10:05 AM Location: [facility] Patient #: [id number] DOB: Apr 30, 1955 Divorced / Language: Cleophus Molt / Race: White Female  History of Present Illness Adin Hector MD; 12/08/2017 10:28 AM) The patient is a 62 year old female who presents with a hiatal hernia. Note for "Hiatal hernia": ` ` ` Patient sent for surgical consultation at the request of Dr Loletha Carrow  Chief Complaint: worsening hiatal hernia ` ` The patient is a woman that is struggled with heartburn and reflux for many decades. He has a known hiatal hernia. Was not particularly large a decade ago. Struggles with dysphailatations for ated to a Schatzki's ring. Esophagitis noted as well.o a Schatzki's ring. Esophagitis noted as well.o a Schatzki's ring. Esophagitis noted as well. Has had to adjust her diet. Cannot tolerate meats or breads. She has been on an antiacid medications fodjust the medicationn and reflux. She gets severe reflux and bending over reflux. She gets severe reflux and bending over reflux. She gets severe reflux and bending over, lying flat. She has to geeep virt if she coughs or sneezes.n. She will get reflux if she coughs or sneezes. She is had some unintentional weight loss with this. She had an abdominal hysterectomy ies. She believe sand wondered if that was an issue as well. I see no record of this.t was an issue as well. I see no record of this.t was an issue as well. I see no record of this. She was evaluated by Dr. Gala Romney for many years. Had treatment there up in [place]. Then wished for second opinion through the [facility] gastrology group. Esophagitis and stricturing usted. Manometry ordered. Antacid medication adjusted. Manometry ordered. Was felt because her hiatal hernia had gotten obviously larger and her symptoms have worsened, consider surgical evaluation and treatment.  No personal nor family history of GI/colon cancer,  inflammatory bowel disease, irritable bowel syndrome, allergy such as Celiac Sprue, dietary/dairy problems, colitis, ulcers nor gastritis. No recent sick contacts/gastroenteritis. No travel outside the country. No changes in diet. No dysphagia to solids or liquids. No significant heartburn or reflux. No hematochezia, hematemesis, coffee ground emesis. No evidence of prior gastric/peptic ulceration. she has some occasional rectal bleeding but nothing too severe. Moves her bowels most days. She has to walk with a cane duues. a lot of lower extremity and back chronic issues. Can walk about 20 minutes she yoes not smoke. She is not a diabetic.es. She does not smoke. She is not a diabetic. He is not on any blood thinners. She does have complaints of postnasal drip that Zyrtec and saline drops somewhat help control. She comes today with her ex-husband.  (Review of systems as stated in this history (HPI) or in the review of systems. Otherwise all other 12 point ROS are negative) ` ` `   Past Surgical History Illene Regulus, CMA; 12/06/2017 10:17 AM) Appendectomy Hysterectomy (not due to cancer) - Complete  Allergies (Alisha Spillers, CMA; 12/06/2017 10:09 AM) Sulfacetamide *CHEMICALS* Penicillin G Benzathine & Proc *PENICILLINS* Morphine Sulfate *ANALGESICS - OPIOID*  Medication History (Alisha Spillers, CMA; 12/06/2017 10:17 AM) busPIRone HCl (5MG  Tablet, Oral) Active. clonazePAM (0.5MG  Tablet, Oral) Active. traMADol HCl (50MG  Tablet, Oral) Active. Famotidine (40MG  Tablet, Oral) Active. Primidone (250MG  Tablet, Oral) Active. FLUoxetine HCl (10MG  Capsule, Oral) Active. Dexilant (60MG  Capsule DR, Oral) Active. Oxybutynin Chloride ER (10MG  Tablet ER 24HR, Oral) Active. Vitamin C (500MG  Tablet Chewable, Oral) Active. Medications Reconciled  Social History Illene Regulus, CMA; 12/06/2017 10:17 AM) Alcohol use Occasional alcohol  use. Caffeine use Carbonated  beverages, Tea. No drug use Tobacco use Never smoker.  Family History Illene Regulus, [place]; 12/06/2017 10:17 AM) Alcohol Abuse Father. Arthritis Father, Mother, Sister. Cancer Father, Sister. Depression Mother. Ovarian Cancer Family Members In General. Seizure disorder Family Members In General.     Review of Systems Lars Mage Spillers CMA; 12/06/2017 10:17 AM) General Not Present- Appetite Loss, Chills, Fatigue, Fever, Night Sweats, Weight Gain and Weight Loss. Skin Not Present- Change in Wart/Mole, Dryness, Hives, Jaundice, New Lesions, Non-Healing Wounds, Rash and Ulcer. HEENT Present- Hoarseness. Not Present- Earache, Hearing Loss, Nose Bleed, Oral Ulcers, Ringing in the Ears, Seasonal Allergies, Sinus Pain, Sore Throat, Visual Disturbances, Wears glasses/contact lenses and Yellow Eyes. Gastrointestinal Present- Abdominal Pain, Difficulty Swallowing and Nausea. Not Present- Bloating, Bloody Stool, Change in Bowel Habits, Chronic diarrhea, Constipation, Excessive gas, Gets full quickly at meals, Hemorrhoids, Indigestion, Rectal Pain and Vomiting. Female Genitourinary Not Present- Frequency, Nocturia, Painful Urination, Pelvic Pain and Urgency. Neurological Present- Seizures and Trouble walking. Not Present- Decreased Memory, Fainting, Headaches, Numbness, Tingling, Tremor and Weakness. Psychiatric Present- Anxiety and Frequent crying. Not Present- Bipolar, Change in Sleep Pattern, Depression and Fearful. Hematology Present- Easy Bruising. Not Present- Blood Thinners, Excessive bleeding, Gland problems, HIV and Persistent Infections.  Vitals (Alisha Spillers CMA; 12/06/2017 10:07 AM) 12/06/2017 10:06 AM Weight: 156 lb Height: 63.5in Body Surface Area: 1.75 m Body Mass Index: 27.2 kg/m  Pulse: 102 (Regular)  BP: 142/70 (Sitting, Left Arm, Standard)      Physical Exam Adin Hector MD; 12/08/2017 10:22 AM)  General Mental Status-Alert. General  Appearance-Not in acute distress, Not Sickly. Orientation-Oriented X3. Hydration-Well hydrated. Voice-Normal.  Integumentary Global Assessment Upon inspection and palpation of skin surfaces of the - Axillae: non-tender, no inflammation or ulceration, no drainage. and Distribution of scalp and body hair is normal. General Characteristics Temperature - normal warmth is noted.  Head and Neck Head-normocephalic, atraumatic with no lesions or palpable masses. Face Global Assessment - atraumatic, no absence of expression. Neck Global Assessment - no abnormal movements, no bruit auscultated on the right, no bruit auscultated on the left, no decreased range of motion, non-tender. Trachea-midline. Thyroid Gland Characteristics - non-tender.  Eye Eyeball - Left-Extraocular movements intact, No Nystagmus. Eyeball - Right-Extraocular movements intact, No Nystagmus. Cornea - Left-No Hazy. Cornea - Right-No Hazy. Sclera/Conjunctiva - Left-No scleral icterus, No Discharge. Sclera/Conjunctiva - Right-No scleral icterus, No Discharge. Pupil - Left-Direct reaction to light normal. Pupil - Right-Direct reaction to light normal.  ENMT Ears Pinna - Left - no drainage observed, no generalized tenderness observed. Right - no drainage observed, no generalized tenderness observed. Nose and Sinuses External Inspection of the Nose - no destructive lesion observed. Inspection of the nares - Left - quiet respiration. Right - quiet respiration. Mouth and Throat Lips - Upper Lip - no fissures observed, no pallor noted. Lower Lip - no fissures observed, no pallor noted. Nasopharynx - no discharge present. Oral Cavity/Oropharynx - Tongue - no dryness observed. Oral Mucosa - no cyanosis observed. Hypopharynx - no evidence of airway distress observed.  Chest and Lung Exam Inspection Movements - Normal and Symmetrical. Accessory muscles - No use of accessory muscles in  breathing. Palpation Palpation of the chest reveals - Non-tender. Auscultation Breath sounds - Normal and Clear.  Cardiovascular Auscultation Rhythm - Regular. Murmurs & Other Heart Sounds - Auscultation of the heart reveals - No Murmurs and No Systolic Clicks.  Abdomen Inspection Inspection of the abdomen reveals - No Visible peristalsis and No  Abnormal pulsations. Umbilicus - No Bleeding, No Urine drainage. Palpation/Percussion Palpation and Percussion of the abdomen reveal - Soft, Non Tender, No Rebound tenderness, No Rigidity (guarding) and No Cutaneous hyperesthesia. Note: Abdomen soft. Not severely distended. No distasis recti. No umbilical or other anterior abdominal wall hernias  Female Genitourinary Sexual Maturity Tanner 5 - Adult hair pattern. Note: No vaginal bleeding nor discharge  Peripheral Vascular Upper Extremity Inspection - Left - No Cyanotic nailbeds, Not Ischemic. Right - No Cyanotic nailbeds, Not Ischemic.  Neurologic Neurologic evaluation reveals -normal attention span and ability to concentrate, able to name objects and repeat phrases. Appropriate fund of knowledge , normal sensation and normal coordination. Mental Status Affect - not angry, not paranoid. Cranial Nerves-Normal Bilaterally. Gait-Normal.  Neuropsychiatric Mental status exam performed with findings of-able to articulate well with normal speech/language, rate, volume and coherence, thought content normal with ability to perform basic computations and apply abstract reasoning and no evidence of hallucinations, delusions, obsessions or homicidal/suicidal ideation.  Musculoskeletal Global Assessment Spine, Ribs and Pelvis - no instability, subluxation or laxity. Right Upper Extremity - no instability, subluxation or laxity.  Lymphatic Head & Neck  General Head & Neck Lymphatics: Bilateral - Description - No Localized lymphadenopathy. Axillary  General Axillary Region:  Bilateral - Description - No Localized lymphadenopathy. Femoral & Inguinal  Generalized Femoral & Inguinal Lymphatics: Left - Description - No Localized lymphadenopathy. Right - Description - No Localized lymphadenopathy.    Assessment & Plan Adin Hector MD; 12/08/2017 10:57 AM)  PARAESOPHAGEAL HIATAL HERNIA (K44.9) Impression: Severe heartburn and reflux and esophagitis with chronic stricturing requiring dilatations. Question of Schatxki ring versus reflux scarring.  Enlarging hiatal hernia compared to prior study/scans many years ago.  She is reaching a point where her dysphagia is worsening, and heartburn medications are no longer working.  I think she would benefit from hiatal hernia repair. Minimally invasive/robotic approach. At least partial fundoplication.  No evidence of delayed gastric emptying, so no need for pyloromyotomy  I think she has pretty good exercise tolerance, but with her difficulty walking and chronic pain and back issues, I would like cardiac clearance to make sure that her operative risks are assessed.  Follow-up on esophageal manometry. If significant dysmotility, would plan partial fundoplication.  Upper GI/esophagram to see if there are significant endoluminal stricturing. May require a pre/intraoperative EGD/dilatation again. We will see.  She was convinced she has gallstones but I see no record of that. She gives me no good story of biliary colic. I don't think I would offer cholecystectomy at this time unless someone else knows otherwise.  With her chronic pain and anxiety issues, I cautioned that her recovery will be challenged. However, she is quite miserable and has been trying to manage this without surgery for over a decade. She is interested in proceeding with surgery. She understands she will not be eating normally after this, but I am hopeful that she can get to a better place with our help.  Current Plans I recommended obtaining preoperative  cardiac clearance. I am concerned about the health of the patient and the ability to tolerate the operation. Therefore, we will request clearance by cardiology to better assess operative risk & see if a reevaluation, further workup, etc is needed. Also recommendations on how medications such as for anticoagulation and blood pressure should be managed/held/restarted after surgery. You are being scheduled for surgery- Our schedulers will call you.  You should hear from our office's scheduling department within 5 working days  about the location, date, and time of surgery. We try to make accommodations for patient's preferences in scheduling surgery, but sometimes the OR schedule or the surgeon's schedule prevents [place] from making those accommodations.  If you have not heard from our office [phone]) in 5 working days, call the office and ask for your surgeon's nurse.  If you have other questions about your diagnosis, plan, or surgery, call the office and ask for your surgeon's nurse.  Follow Up - Call [facility] office after tests / studies doneto discuss further plans Pt Education - [facility] Esophageal Surgery Diet HCI ([place] Routt): discussed with patient and provided information. Pt Education - [facility] Laparoscopic Surgery HCI The anatomy & physiology of the foregut and anti-reflux mechanism was discussed. The pathophysiology of hiatal herniation and GERD was discussed. Natural history risks without surgery was discussed. The patient's symptoms are not adequately controlled by medicines and other non-operative treatments. I feel the risks of no intervention will lead to serious problems that outweigh the operative risks; therefore, I recommended surgery to reduce the hiatal hernia out of the chest and fundoplication to rebuild the anti-reflux valve and control reflux better. Need for a thorough workup to rule out the differential diagnosis and plan treatment was explained. I explained laparoscopic techniques  with possible need for an open approach.  Risks such as bleeding, infection, abscess, leak, need for further treatment, heart attack, death, and other risks were discussed. I noted a good likelihood this will help address the problem. Goals of post-operative recovery were discussed as well. Possibility that this will not correct all symptoms was explained. Post-operative dysphagia, need for short-term liquid & pureed diet, inability to vomit, possibility of reherniation, possible need for medicines to help control symptoms in addition to surgery were discussed. We will work to minimize complications. Educational handouts further explaining the pathology, treatment options, and dysphagia diet was given as well. Questions were answered. The patient expresses understanding & wishes to proceed with surgery.  Adin Hector, MD, FACS, MASCRS Gastrointestinal and Minimally Invasive Surgery    [address], [place], [place] [address] [phone] Main / Paging [phone] Fax

## 2017-12-12 ENCOUNTER — Other Ambulatory Visit: Payer: Self-pay | Admitting: Surgery

## 2017-12-12 DIAGNOSIS — R1310 Dysphagia, unspecified: Secondary | ICD-10-CM

## 2017-12-15 ENCOUNTER — Encounter: Payer: Self-pay | Admitting: Cardiovascular Disease

## 2017-12-15 ENCOUNTER — Ambulatory Visit
Admission: RE | Admit: 2017-12-15 | Discharge: 2017-12-15 | Disposition: A | Discharge status: Home / Self Care | Payer: Medicare HMO | Source: Ambulatory Visit | Attending: Surgery | Admitting: Surgery

## 2017-12-15 DIAGNOSIS — K449 Diaphragmatic hernia without obstruction or gangrene: Secondary | ICD-10-CM | POA: Diagnosis not present

## 2017-12-15 DIAGNOSIS — R1310 Dysphagia, unspecified: Secondary | ICD-10-CM

## 2017-12-16 ENCOUNTER — Ambulatory Visit (INDEPENDENT_AMBULATORY_CARE_PROVIDER_SITE_OTHER): Payer: Medicare HMO | Admitting: Cardiovascular Disease

## 2017-12-16 ENCOUNTER — Encounter: Payer: Self-pay | Admitting: Cardiovascular Disease

## 2017-12-16 VITALS — BP 130/76 | HR 107 | Ht 62.0 in | Wt 156.6 lb

## 2017-12-16 DIAGNOSIS — Z01810 Encounter for preprocedural cardiovascular examination: Secondary | ICD-10-CM

## 2017-12-16 DIAGNOSIS — R1310 Dysphagia, unspecified: Secondary | ICD-10-CM

## 2017-12-16 DIAGNOSIS — K449 Diaphragmatic hernia without obstruction or gangrene: Secondary | ICD-10-CM

## 2017-12-16 NOTE — Patient Instructions (Signed)
Medication Instructions:  Your physician recommends that you continue on your current medications as directed. Please refer to the Current Medication list given to you today.   If you need a refill on your cardiac medications before your next appointment, please call your pharmacy.   Lab work: none If you have labs (blood work) drawn today and your tests are completely normal, you will receive your results only by: . MyChart Message (if you have MyChart) OR . A paper copy in the mail If you have any lab test that is abnormal or we need to change your treatment, we will call you to review the results.  Testing/Procedures: none  Follow-Up: Your physician recommends that you schedule a follow-up appointment as needed with Dr. McAlhany  

## 2017-12-16 NOTE — Progress Notes (Signed)
Chief Complaint  Patient presents with  . New Patient (Initial Visit)     History of Present Illness: 62 yo female with history of anxiety, depression, GERD, seizure disorder, fibromyalgia, rheumatoid arthritis and esophageal stricture here today as a new consult, referred by Dr. Johney Maine, for cardiac risk stratification prior to planned hiatal hernia surgery. She has chest pressure after meals relieved with vomiting. No exertional chest pain. No dyspnea. No dizziness, near syncope or syncope. She has never smoked. No prior cardiac disease. She has no limitation of her exercise tolerance. No lower extremity edema or weight gain.   Primary Care Physician: Glenda Chroman, MD   Past Medical History:  Diagnosis Date  . Allergy   . Anxiety   . Complication of anesthesia   . Depression   . Diverticulosis   . Dysphagia   . Epilepsy ([place])   . Fecal incontinence   . Fibromyalgia   . Fractures   . GERD (gastroesophageal reflux disease)    Last EGD with 102F dilation by Dr. Gala Romney 07/29/2006  . Hemorrhoids 10/2004   Otherwise normal colonoscopy by Dr. Gala Romney  . Hepatitis C antibody test positive    confirmed positive ab during 07/2011 hospitalization  . Hiatal hernia   . Osteoarthritis   . Osteopenia   . Osteoporosis   . Peptic stricture of esophagus    2006  . Polysubstance abuse ([place])    positive UDS for cocaine, etoh abuse  . PONV (postoperative nausea and vomiting)   . Rheumatoid arthritis(714.0)   . Schatzki's ring    multiple dilations  . Seasonal allergies   . Seizures ([place])     Past Surgical History:  Procedure Laterality Date  . APPENDECTOMY    . BACK SURGERY    . COLONOSCOPY  10/08/10   [id number] diverticulosis otherwise normal colon and rectum  . ESOPHAGEAL MANOMETRY N/A 11/30/2017   Procedure: ESOPHAGEAL MANOMETRY (EM);  Surgeon: Mauri Pole, MD;  Location: [facility];  Service: Endoscopy;  Laterality: N/A;  . ESOPHAGOGASTRODUODENOSCOPY  10/08/10   [id number]  hiatal hernia otherwise normal  . EYE SURGERY     right  . LAPAROSCOPIC HYSTERECTOMY    . NOSE SURGERY    . RECTOCELE REPAIR    . S/P Hysterectomy    . Thumb surgery Left   . TUBAL LIGATION      Current Outpatient Medications  Medication Sig Dispense Refill  . Ascorbic Acid (VITAMIN C) 1000 MG tablet Take 1,000 mg by mouth as needed.     . Calcium Carbonate Antacid (TUMS E-X PO) Take by mouth as needed.    . cetirizine (ZYRTEC) 10 MG tablet Take 1 tablet by mouth daily.  2  . chlordiazePOXIDE (LIBRIUM) 5 MG capsule Take 1 capsule by mouth 3 (three) times daily.  3  . dexlansoprazole (DEXILANT) 60 MG capsule Take 1 capsule (60 mg total) by mouth daily. 31 capsule 3  . FLUoxetine (PROZAC) 10 MG tablet Take 1 capsule by mouth daily.     Marland Kitchen ibuprofen (ADVIL,MOTRIN) 200 MG tablet Take 200 mg by mouth every 6 (six) hours as needed.    Marland Kitchen omeprazole (PRILOSEC) 20 MG capsule Take 20 mg by mouth at bedtime.    Marland Kitchen oxybutynin (DITROPAN-XL) 10 MG 24 hr tablet Take 1 tablet by mouth daily.    . primidone (MYSOLINE) 250 MG tablet Take 250 mg by mouth 3 x daily with food.      . ranitidine (ZANTAC) 150 MG tablet Take 150-300 mg  by mouth at bedtime.    . traMADol (ULTRAM) 50 MG tablet Take 1 tablet by mouth 3 (three) times daily as needed.  0   Current Facility-Administered Medications  Medication Dose Route Frequency Provider Last Rate Last Dose  . 0.9 %  sodium chloride infusion  500 mL Intravenous Once Doran Stabler, MD        Allergies  Allergen Reactions  . Morphine And Related   . Penicillins Other (See Comments)    Turned purple with Penicillin shot when younger  . Sulfonamide Derivatives     REACTION: UNKNOWN REACTION    Social History   Socioeconomic History  . Marital status: Divorced    Spouse name: Not on file  . Number of children: 1  . Years of education: Not on file  . Highest education level: Not on file  Occupational History  . Occupation: disability    Employer:  NOT EMPLOYED  Social Needs  . Financial resource strain: Not on file  . Food insecurity:    Worry: Not on file    Inability: Not on file  . Transportation needs:    Medical: Not on file    Non-medical: Not on file  Tobacco Use  . Smoking status: Never Smoker  . Smokeless tobacco: Never Used  . Tobacco comment: quit as a teenager  Substance and Sexual Activity  . Alcohol use: Yes    Comment: 2-4 beers per month. Last beer on Sunday.   . Drug use: No    Comment: h/o cocaine in past; none since June   . Sexual activity: Not on file  Lifestyle  . Physical activity:    Days per week: Not on file    Minutes per session: Not on file  . Stress: Not on file  Relationships  . Social connections:    Talks on phone: Not on file    Gets together: Not on file    Attends religious service: Not on file    Active member of club or organization: Not on file    Attends meetings of clubs or organizations: Not on file    Relationship status: Not on file  . Intimate partner violence:    Fear of current or ex partner: Not on file    Emotionally abused: Not on file    Physically abused: Not on file    Forced sexual activity: Not on file  Other Topics Concern  . Not on file  Social History Narrative  . Not on file    Family History  Problem Relation Age of Onset  . Leukemia Father   . Alcohol abuse Father   . Arthritis Father   . Cancer Father   . Alzheimer's disease Mother   . Arthritis Mother   . Depression Mother   . Arthritis Sister   . Diabetes Sister   . Cancer Sister   . Leukemia Sister   . Anesthesia problems Neg Hx   . Hypotension Neg Hx   . Malignant hyperthermia Neg Hx   . Pseudochol deficiency Neg Hx   . Colon cancer Neg Hx   . Esophageal cancer Neg Hx   . Stomach cancer Neg Hx   . Rectal cancer Neg Hx     Review of Systems:  As stated in the HPI and otherwise negative.   BP 130/76   Pulse (!) 107   Ht 5\' 2"  (1.575 m)   Wt 156 lb 9.6 oz (71 kg)   SpO2 99%  BMI 28.64 kg/m   Physical Examination: General: Well developed, well nourished, NAD  HEENT: OP clear, mucus membranes moist  SKIN: warm, dry. No rashes. Neuro: No focal deficits  Musculoskeletal: Muscle strength 5/5 all ext  Psychiatric: Mood and affect normal  Neck: No JVD, no carotid bruits, no thyromegaly, no lymphadenopathy.  Lungs:Clear bilaterally, no wheezes, rhonci, crackles Cardiovascular: Regular rhythm, tachy No murmurs, gallops or rubs. Abdomen:Soft. Bowel sounds present. Non-tender.  Extremities: No lower extremity edema. Pulses are 2 + in the bilateral DP/PT.  EKG:  EKG is not ordered today. The ekg ordered today demonstrates sinus tachycardia, rate 107 bpm.   Recent Labs: No results found for requested labs within last 8760 hours.   Lipid Panel No results found for: CHOL, TRIG, HDL, CHOLHDL, VLDL, LDLCALC, LDLDIRECT   Wt Readings from Last 3 Encounters:  12/16/17 156 lb 9.6 oz (71 kg)  11/30/17 151 lb 0.2 oz (68.5 kg)  11/10/17 151 lb (68.5 kg)     Other studies Reviewed: Additional studies/ records that were reviewed today include:  Review of the above records demonstrates:    Assessment and Plan:   1. Pre-operative cardiovascular risk assessment: She has no signs or symptoms of angina, congestive heart failure or arrhythmias. Her EKG shows sinus tachycardia. Cardiac exam is normal. She has no DM, HTN, HLD or history of tobacco abuse. No family history of premature CAD. She can proceed with her planned surgical procedure without further cardiac evaluation. I will route this note to Dr. Johney Maine.   Current medicines are reviewed at length with the patient today.  The patient does not have concerns regarding medicines.  The following changes have been made:  no change  Labs/ tests ordered today include:  No orders of the defined types were placed in this encounter.    Disposition:   FU with me as needed.    Signed, Lauree Chandler, MD 12/16/2017  1:56 PM    [facility] [address], [place], [place]  [address] Phone: [phone]; Fax: [phone]

## 2017-12-28 DIAGNOSIS — F329 Major depressive disorder, single episode, unspecified: Secondary | ICD-10-CM | POA: Diagnosis not present

## 2017-12-28 DIAGNOSIS — M159 Polyosteoarthritis, unspecified: Secondary | ICD-10-CM | POA: Diagnosis not present

## 2017-12-28 DIAGNOSIS — M069 Rheumatoid arthritis, unspecified: Secondary | ICD-10-CM | POA: Diagnosis not present

## 2018-01-09 DIAGNOSIS — Z6827 Body mass index (BMI) 27.0-27.9, adult: Secondary | ICD-10-CM | POA: Diagnosis not present

## 2018-01-09 DIAGNOSIS — F419 Anxiety disorder, unspecified: Secondary | ICD-10-CM | POA: Diagnosis not present

## 2018-01-09 DIAGNOSIS — B182 Chronic viral hepatitis C: Secondary | ICD-10-CM | POA: Diagnosis not present

## 2018-01-09 DIAGNOSIS — Z299 Encounter for prophylactic measures, unspecified: Secondary | ICD-10-CM | POA: Diagnosis not present

## 2018-01-09 DIAGNOSIS — R569 Unspecified convulsions: Secondary | ICD-10-CM | POA: Diagnosis not present

## 2018-01-27 DIAGNOSIS — F329 Major depressive disorder, single episode, unspecified: Secondary | ICD-10-CM | POA: Diagnosis not present

## 2018-01-27 DIAGNOSIS — M159 Polyosteoarthritis, unspecified: Secondary | ICD-10-CM | POA: Diagnosis not present

## 2018-01-27 DIAGNOSIS — M069 Rheumatoid arthritis, unspecified: Secondary | ICD-10-CM | POA: Diagnosis not present

## 2018-02-15 NOTE — Patient Instructions (Signed)
[name] [name]  02/15/2018   Your procedure is scheduled on: 02-22-2018  Report to [facility] Main  Entrance  Report to admitting at 630 AM    Call this number if you have problems the morning of surgery [phone]    Remember: Do not eat food or drink liquids :After Midnight. BRUSH YOUR TEETH MORNING OF SURGERY AND RINSE YOUR MOUTH OUT, NO CHEWING GUM CANDY OR MINTS.  NO SOLID FOOD AFTER MIDNIGHT THE NIGHT PRIOR TO SURGERY. NOTHING BY MOUTH EXCEPT CLEAR LIQUIDS UNTIL 3 HOURS PRIOR TO [place] SURGERY. PLEASE FINISH ENSURE DRINK PER SURGEON ORDER 3 HOURS PRIOR TO SCHEDULED SURGERY TIME WHICH NEEDS TO BE COMPLETED AT 530 am.    CLEAR LIQUID DIET   Foods Allowed                                                                     Foods Excluded  Coffee and tea, regular and decaf                             liquids that you cannot  Plain Jell-O in any flavor                                             see through such as: Fruit ices (not with fruit pulp)                                     milk, soups, orange juice  Iced Popsicles                                    All solid food Carbonated beverages, regular and diet                                    Cranberry, grape and apple juices Sports drinks like Gatorade Lightly seasoned clear broth or consume(fat free) Sugar, honey syrup  Sample Menu Breakfast                                Lunch                                     Supper Cranberry juice                    Beef broth                            Chicken broth Jell-O  Grape juice                           Apple juice Coffee or tea                        Jell-O                                      Popsicle                                                Coffee or tea                        Coffee or tea  _____________________________________________________________________    Take these medicines the morning of  surgery with A SIP OF WATER: alprazolam(xanax), primidone (mrsoline), dexilant, pepcid if needed              You may not have any metal on your body including hair pins and              piercings  Do not wear jewelry, make-up, lotions, powders or perfumes, deodorant             Do not wear nail polish.  Do not shave  48 hours prior to surgery.              Men may shave face and neck.   Do not bring valuables to the hospital. [place].  Contacts, dentures or bridgework may not be worn into surgery.  Leave suitcase in the car. After surgery it may be brought to your room.                  Please read over the following fact sheets you were given: _____________________________________________________________________  [facility] - Preparing for Surgery Before surgery, you can play an important role.  Because skin is not sterile, your skin needs to be as free of germs as possible.  You can reduce the number of germs on your skin by washing with CHG (chlorahexidine gluconate) soap before surgery.  CHG is an antiseptic cleaner which kills germs and bonds with the skin to continue killing germs even after washing. Please DO NOT use if you have an allergy to CHG or antibacterial soaps.  If your skin becomes reddened/irritated stop using the CHG and inform your nurse when you arrive at Short Stay. Do not shave (including legs and underarms) for at least 48 hours prior to the first CHG shower.  You may shave your face/neck. Please follow these instructions carefully:  1.  Shower with CHG Soap the night before surgery and the  morning of Surgery.  2.  If you choose to wash your hair, wash your hair first as usual with your  normal  shampoo.  3.  After you shampoo, rinse your hair and body thoroughly to remove the  shampoo.                           4.  Use CHG as  you would any other liquid soap.  You can apply chg directly  to the skin and wash                        Gently with a scrungie or clean washcloth.  5.  Apply the CHG Soap to your body ONLY FROM THE NECK DOWN.   Do not use on face/ open                           Wound or open sores. Avoid contact with eyes, ears mouth and genitals (private parts).                       Wash face,  Genitals (private parts) with your normal soap.             6.  Wash thoroughly, paying special attention to the area where your surgery  will be performed.  7.  Thoroughly rinse your body with warm water from the neck down.  8.  DO NOT shower/wash with your normal soap after using and rinsing off  the CHG Soap.                9.  Pat yourself dry with a clean towel.            10.  Wear clean pajamas.            11.  Place clean sheets on your bed the night of your first shower and do not  sleep with pets. Day of Surgery : Do not apply any lotions/deodorants the morning of surgery.  Please wear clean clothes to the hospital/surgery center.  FAILURE TO FOLLOW THESE INSTRUCTIONS MAY RESULT IN THE CANCELLATION OF YOUR SURGERY PATIENT SIGNATURE_________________________________  NURSE SIGNATURE__________________________________  ________________________________________________________________________

## 2018-02-15 NOTE — Progress Notes (Signed)
Cardiac clearance note dr Angelena Form 12-16-17 epic ekg 12-16-17 epic

## 2018-02-17 ENCOUNTER — Encounter (HOSPITAL_COMMUNITY): Payer: Self-pay

## 2018-02-17 ENCOUNTER — Encounter (HOSPITAL_COMMUNITY)
Admission: RE | Admit: 2018-02-17 | Discharge: 2018-02-17 | Disposition: A | Discharge status: Home / Self Care | Payer: Medicare Other | Source: Ambulatory Visit | Attending: Surgery | Admitting: Surgery

## 2018-02-17 ENCOUNTER — Other Ambulatory Visit: Payer: Self-pay

## 2018-02-17 DIAGNOSIS — Z01812 Encounter for preprocedural laboratory examination: Secondary | ICD-10-CM | POA: Diagnosis not present

## 2018-02-17 HISTORY — DX: Family history of other specified conditions: Z84.89

## 2018-02-17 HISTORY — DX: Chronic sinusitis, unspecified: J32.9

## 2018-02-17 HISTORY — DX: Irritable bowel syndrome, unspecified: K58.9

## 2018-02-17 LAB — COMPREHENSIVE METABOLIC PANEL
ALBUMIN: 4.5 g/dL (ref 3.5–5.0)
ALK PHOS: 139 U/L — AB (ref 38–126)
ALT: 22 U/L (ref 0–44)
AST: 28 U/L (ref 15–41)
Anion gap: 10 (ref 5–15)
BUN: 12 mg/dL (ref 8–23)
CO2: 24 mmol/L (ref 22–32)
Calcium: 8.8 mg/dL — ABNORMAL LOW (ref 8.9–10.3)
Chloride: 100 mmol/L (ref 98–111)
Creatinine, Ser: 0.66 mg/dL (ref 0.44–1.00)
GFR calc Af Amer: >60 mL/min (ref >60)
GFR calc non Af Amer: >60 mL/min (ref >60)
GLUCOSE: 96 mg/dL (ref 70–99)
Potassium: 4.8 mmol/L (ref 3.5–5.1)
Sodium: 134 mmol/L — ABNORMAL LOW (ref 135–145)
Total Bilirubin: 0.4 mg/dL (ref 0.3–1.2)
Total Protein: 7.7 g/dL (ref 6.5–8.1)

## 2018-02-17 LAB — CBC
HCT: 34.8 % — ABNORMAL LOW (ref 36.0–46.0)
Hemoglobin: 10.3 g/dL — ABNORMAL LOW (ref 12.0–15.0)
MCH: 24.3 pg — ABNORMAL LOW (ref 26.0–34.0)
MCHC: 29.6 g/dL — AB (ref 30.0–36.0)
MCV: 82.3 fL (ref 80.0–100.0)
Platelets: 269 10*3/uL (ref 150–400)
RBC: 4.23 MIL/uL (ref 3.87–5.11)
RDW: 16.5 % — AB (ref 11.5–15.5)
WBC: 3.7 10*3/uL — ABNORMAL LOW (ref 4.0–10.5)
nRBC: 0 % (ref 0.0–0.2)

## 2018-02-17 NOTE — Progress Notes (Signed)
PCP: DR Rennis Petty  CARDIOLOGIST: NONE  INFO IN Epic: CARDIAC CLEARANCE DR Angelena Form 12-16-17 Epic, EKG 12-16-17 EPIC  INFO ON CHART: NONE  BLOOD THINNERS AND LAST DOSES: NONE ____________________________________  PATIENT SYMPTOMS AT TIME OF PREOP: NONE

## 2018-02-20 NOTE — Anesthesia Preprocedure Evaluation (Addendum)
Anesthesia Evaluation  Patient identified by MRN, date of birth, ID band Patient awake    Reviewed: Allergy & Precautions, NPO status , Patient's Chart, lab work & pertinent test results  History of Anesthesia Complications (+) PONV and history of anesthetic complications  Airway Mallampati: II  TM Distance: >3 FB Neck ROM: Full    Dental no notable dental hx. (+) Dental Advisory Given   Pulmonary neg pulmonary ROS,    Pulmonary exam normal        Cardiovascular Normal cardiovascular exam  Seen by cardiologist, Dr. Lauree Chandler, as a new patient for cardiac clearance on 12/16/17.  Per his note, "She has no signs or symptoms of angina, congestive heart failure or arrhythmias. Her EKG shows sinus tachycardia. Cardiac exam is normal. She has no DM, HTN, HLD or history of tobacco abuse. No family history of premature CAD. She can proceed with her planned surgical procedure without further cardiac evaluation."   Neuro/Psych Seizures -,  PSYCHIATRIC DISORDERS Anxiety Depression    GI/Hepatic hiatal hernia, GERD  ,(+) Hepatitis -, C  Endo/Other  negative endocrine ROS  Renal/GU negative Renal ROS     Musculoskeletal  (+) Arthritis , Fibromyalgia -  Abdominal   Peds  Hematology negative hematology ROS (+)   Anesthesia Other Findings Day of surgery medications reviewed with the patient.  Reproductive/Obstetrics                            Anesthesia Physical Anesthesia Plan  ASA: III  Anesthesia Plan: General   Post-op Pain Management:    Induction: Intravenous  PONV Risk Score and Plan: 4 or greater and Ondansetron, Dexamethasone, TIVA and Scopolamine patch - Pre-op  Airway Management Planned: Oral ETT  Additional Equipment:   Intra-op Plan:   Post-operative Plan: Extubation in OR  Informed Consent: I have reviewed the patients History and Physical, chart, labs and discussed the  procedure including the risks, benefits and alternatives for the proposed anesthesia with the patient or authorized representative who has indicated his/her understanding and acceptance.     Dental advisory given  Plan Discussed with: CRNA and Anesthesiologist  Anesthesia Plan Comments: (See PST note 02/17/18, Konrad Felix, PA-C)      Anesthesia Quick Evaluation

## 2018-02-20 NOTE — Progress Notes (Signed)
Anesthesia Chart Review   Case:  [id number] Date/Time:  02/22/18 0800   Procedures:      XI ROBOTIC REPAIR OF PARAESOPHAGEAL HIATAL HERNIA WITH FUNDOPLICATION (N/A )     POSSIBLE ESOPHAGOGASTRODUODENOSCOPY (EGD) (N/A )   Anesthesia type:  General   Pre-op diagnosis:  PARASOPHAGEAL HIATAL HERNIA   Location:  [place]   Surgeon:  Michael Boston, MD      DISCUSSION: 63 yo never smoker with h/o PONV, GERD, Schatzki ring (s/p multiple dilations), hiatal hernia, Hepatitis C (positive Hep C antibodies), anxiety, depression, fibromyalgia, seizures, rheumatoid arthritis scheduled for above surgery on 02/22/18 with Dr. Michael Boston.   Seen by cardiologist, Dr. Lauree Chandler, as a new patient for cardiac clearance on 12/16/17.  Per his note, "She has no signs or symptoms of angina, congestive heart failure or arrhythmias. Her EKG shows sinus tachycardia. Cardiac exam is normal. She has no DM, HTN, HLD or history of tobacco abuse. No family history of premature CAD. She can proceed with her planned surgical procedure without further cardiac evaluation."  Remote h/o positive Hepatitis C antibodies noted during hospital admission 07/2011.  She followed up with GI who ordered updated HFP, RNQ quantitative with reflex genotype.  Per GI note 12/22/11 by Laban Emperor, NP if active Hep C would refer to Hep C clinic.  It appears that pt never returned to this clinic and was discharged from the practice.  Pt seen by Dr. Wilfrid Lund on 08/30/17.  Per his note, "She has a reported history of hepatitis C, and she reports that it was treated, however details of that are not available." I called Dr. Michael Boston to discuss, he states that most recent labs have been stable, proceed with surgery from his standpoint. AST/ALT normal range at PST visit 02/17/18.   Pt can proceed with planned procedure barring acute status change.  VS: BP (!) 153/79   Temp 36.7 C (Oral)   Resp 18   Ht 5\' 2"  (1.575 m)   Wt 68.5 kg    SpO2 100%   BMI 27.62 kg/m   PROVIDERS: Glenda Chroman, MD is PCP  Lauree Chandler, MD is Cardiologist  LABS: Labs reviewed: Acceptable for surgery. (all labs ordered are listed, but only abnormal results are displayed)  Labs Reviewed  COMPREHENSIVE METABOLIC PANEL - Abnormal; Notable for the following components:      Result Value   Sodium 134 (*)    Calcium 8.8 (*)    Alkaline Phosphatase 139 (*)    All other components within normal limits  CBC - Abnormal; Notable for the following components:   WBC 3.7 (*)    Hemoglobin 10.3 (*)    HCT 34.8 (*)    MCH 24.3 (*)    MCHC 29.6 (*)    RDW 16.5 (*)    All other components within normal limits     IMAGES:   EKG: 12/16/17  Rate 107bpm Sinus tachycardia Minimal voltage criteria for LVH, may be normal variant Borderline ECG  CV:  Past Medical History:  Diagnosis Date  . Allergy   . Anxiety   . Complication of anesthesia   . Depression   . Diverticulosis   . Dysphagia   . Epilepsy ([place])   . Family history of adverse reaction to anesthesia    sisters with ponv  . Fecal incontinence    occ  . Fibromyalgia   . Fractures   . GERD (gastroesophageal reflux disease)  Last EGD with 93F dilation by Dr. Gala Romney 07/29/2006  . Hemorrhoids 10/2004   Otherwise normal colonoscopy by Dr. Gala Romney  . Hepatitis C antibody test positive    confirmed positive ab during 07/2011 hospitalization  . Hiatal hernia   . IBS (irritable bowel syndrome)   . Osteoarthritis   . Osteopenia   . Osteoporosis   . Peptic stricture of esophagus    2006  . Polysubstance abuse ([place])    positive UDS for cocaine, etoh abuse  . PONV (postoperative nausea and vomiting)   . Rheumatoid arthritis(714.0)   . Schatzki's ring    multiple dilations  . Seasonal allergies   . Seizures ([place])    last seizure 2009  . Sinus infection    finished antibiotic 02-16-18    Past Surgical History:  Procedure Laterality Date  . APPENDECTOMY    . BACK  SURGERY     lower  . COLONOSCOPY  10/08/10   [id number] diverticulosis otherwise normal colon and rectum  . ESOPHAGEAL MANOMETRY N/A 11/30/2017   Procedure: ESOPHAGEAL MANOMETRY (EM);  Surgeon: Mauri Pole, MD;  Location: [facility];  Service: Endoscopy;  Laterality: N/A;  . ESOPHAGOGASTRODUODENOSCOPY  10/08/10   JSU:NHRVA hiatal hernia otherwise normal  . EYE SURGERY     right cyst removed  . LAPAROSCOPIC HYSTERECTOMY    . left thumb surgery    . NOSE SURGERY    . RECTOCELE REPAIR    . S/P Hysterectomy     complete  . Thumb surgery Left   . TUBAL LIGATION      MEDICATIONS: . ALPRAZolam (XANAX) 0.5 MG tablet  . Camphor-Eucalyptus-Menthol (VAPORX BALM EX)  . cephALEXin (KEFLEX) 500 MG capsule  . dexlansoprazole (DEXILANT) 60 MG capsule  . famotidine (PEPCID) 40 MG tablet  . ibuprofen (ADVIL,MOTRIN) 200 MG tablet  . oxybutynin (DITROPAN-XL) 10 MG 24 hr tablet  . primidone (MYSOLINE) 250 MG tablet  . vitamin C (ASCORBIC ACID) 500 MG tablet   . 0.9 %  sodium chloride infusion     Maia Plan [facility] Pre-Surgical Testing [phone] 02/20/18 5:28 PM

## 2018-02-21 MED ORDER — GENTAMICIN SULFATE 40 MG/ML IJ SOLN
5.0000 mg/kg | INTRAVENOUS | Status: AC
  Administered 2018-02-22: 290 mg via INTRAVENOUS
  Filled 2018-02-21: qty 7.25

## 2018-02-21 MED ORDER — BUPIVACAINE LIPOSOME 1.3 % IJ SUSP
20.0000 mL | INTRAMUSCULAR | Status: DC
  Filled 2018-02-21: qty 20

## 2018-02-22 ENCOUNTER — Ambulatory Visit (HOSPITAL_COMMUNITY): Payer: Medicare Other | Admitting: Certified Registered Nurse Anesthetist

## 2018-02-22 ENCOUNTER — Encounter (HOSPITAL_COMMUNITY)
Admission: RE | Disposition: A | Discharge status: Home Health | Payer: Self-pay | Source: Home / Self Care | Attending: Surgery

## 2018-02-22 ENCOUNTER — Inpatient Hospital Stay (HOSPITAL_COMMUNITY)
Admission: RE | Admit: 2018-02-22 | Discharge: 2018-02-24 | DRG: 328 | Disposition: A | Discharge status: Home Health | Payer: Medicare Other | Attending: Surgery | Admitting: Surgery

## 2018-02-22 ENCOUNTER — Ambulatory Visit (HOSPITAL_COMMUNITY): Payer: Medicare Other | Admitting: Physician Assistant

## 2018-02-22 ENCOUNTER — Other Ambulatory Visit: Payer: Self-pay

## 2018-02-22 ENCOUNTER — Encounter (HOSPITAL_COMMUNITY): Payer: Self-pay

## 2018-02-22 DIAGNOSIS — Z811 Family history of alcohol abuse and dependence: Secondary | ICD-10-CM | POA: Diagnosis not present

## 2018-02-22 DIAGNOSIS — Z602 Problems related to living alone: Secondary | ICD-10-CM

## 2018-02-22 DIAGNOSIS — Z8261 Family history of arthritis: Secondary | ICD-10-CM

## 2018-02-22 DIAGNOSIS — G8929 Other chronic pain: Secondary | ICD-10-CM | POA: Diagnosis present

## 2018-02-22 DIAGNOSIS — K219 Gastro-esophageal reflux disease without esophagitis: Secondary | ICD-10-CM

## 2018-02-22 DIAGNOSIS — Z9889 Other specified postprocedural states: Secondary | ICD-10-CM

## 2018-02-22 DIAGNOSIS — K210 Gastro-esophageal reflux disease with esophagitis: Secondary | ICD-10-CM | POA: Diagnosis present

## 2018-02-22 DIAGNOSIS — R110 Nausea: Secondary | ICD-10-CM | POA: Diagnosis present

## 2018-02-22 DIAGNOSIS — Z820 Family history of epilepsy and other diseases of the nervous system: Secondary | ICD-10-CM | POA: Diagnosis not present

## 2018-02-22 DIAGNOSIS — R0982 Postnasal drip: Secondary | ICD-10-CM | POA: Diagnosis present

## 2018-02-22 DIAGNOSIS — J301 Allergic rhinitis due to pollen: Secondary | ICD-10-CM | POA: Diagnosis present

## 2018-02-22 DIAGNOSIS — Z90710 Acquired absence of both cervix and uterus: Secondary | ICD-10-CM | POA: Diagnosis not present

## 2018-02-22 DIAGNOSIS — F419 Anxiety disorder, unspecified: Secondary | ICD-10-CM | POA: Diagnosis present

## 2018-02-22 DIAGNOSIS — Z8041 Family history of malignant neoplasm of ovary: Secondary | ICD-10-CM | POA: Diagnosis not present

## 2018-02-22 DIAGNOSIS — K222 Esophageal obstruction: Secondary | ICD-10-CM | POA: Diagnosis present

## 2018-02-22 DIAGNOSIS — Z806 Family history of leukemia: Secondary | ICD-10-CM | POA: Diagnosis not present

## 2018-02-22 DIAGNOSIS — K440 Diaphragmatic hernia with obstruction, without gangrene: Secondary | ICD-10-CM | POA: Diagnosis present

## 2018-02-22 DIAGNOSIS — F411 Generalized anxiety disorder: Secondary | ICD-10-CM | POA: Diagnosis present

## 2018-02-22 DIAGNOSIS — Z818 Family history of other mental and behavioral disorders: Secondary | ICD-10-CM | POA: Diagnosis not present

## 2018-02-22 SURGERY — FUNDOPLICATION, NISSEN, ROBOT-ASSISTED, LAPAROSCOPIC
Anesthesia: General

## 2018-02-22 MED ORDER — DEXAMETHASONE SODIUM PHOSPHATE 10 MG/ML IJ SOLN
INTRAMUSCULAR | Status: DC | PRN
  Administered 2018-02-22: 5 mg via INTRAVENOUS

## 2018-02-22 MED ORDER — FENTANYL CITRATE (PF) 100 MCG/2ML IJ SOLN
INTRAMUSCULAR | Status: DC | PRN
  Administered 2018-02-22 (×11): 50 ug via INTRAVENOUS

## 2018-02-22 MED ORDER — PROPOFOL 10 MG/ML IV BOLUS
INTRAVENOUS | Status: DC | PRN
  Administered 2018-02-22: 170 mg via INTRAVENOUS

## 2018-02-22 MED ORDER — GUAIFENESIN-DM 100-10 MG/5ML PO SYRP: 10.0000 mL | ORAL_SOLUTION | ORAL | Status: DC | PRN

## 2018-02-22 MED ORDER — GABAPENTIN 300 MG PO CAPS
300.0000 mg | ORAL_CAPSULE | ORAL | Status: AC
  Administered 2018-02-22: 300 mg via ORAL
  Filled 2018-02-22: qty 1

## 2018-02-22 MED ORDER — MAGIC MOUTHWASH
15.0000 mL | Freq: Four times a day (QID) | ORAL | Status: DC | PRN
  Filled 2018-02-22: qty 15

## 2018-02-22 MED ORDER — POLYETHYLENE GLYCOL 3350 17 G PO PACK
17.0000 g | PACK | Freq: Two times a day (BID) | ORAL | Status: DC
  Administered 2018-02-22 – 2018-02-24 (×4): 17 g via ORAL
  Filled 2018-02-22 (×4): qty 1

## 2018-02-22 MED ORDER — ZOLPIDEM TARTRATE 5 MG PO TABS: 5.0000 mg | ORAL_TABLET | Freq: Every evening | ORAL | Status: DC | PRN

## 2018-02-22 MED ORDER — OXYCODONE HCL 5 MG PO TABS
5.0000 mg | ORAL_TABLET | ORAL | Status: DC | PRN
  Administered 2018-02-23 – 2018-02-24 (×2): 10 mg via ORAL
  Filled 2018-02-22 (×2): qty 2

## 2018-02-22 MED ORDER — EPHEDRINE SULFATE-NACL 50-0.9 MG/10ML-% IV SOSY
PREFILLED_SYRINGE | INTRAVENOUS | Status: DC | PRN
  Administered 2018-02-22 (×2): 5 mg via INTRAVENOUS

## 2018-02-22 MED ORDER — PHENYLEPHRINE 40 MCG/ML (10ML) SYRINGE FOR IV PUSH (FOR BLOOD PRESSURE SUPPORT)
PREFILLED_SYRINGE | INTRAVENOUS | Status: DC | PRN
  Administered 2018-02-22: 120 ug via INTRAVENOUS
  Administered 2018-02-22 (×2): 80 ug via INTRAVENOUS
  Administered 2018-02-22 (×4): 120 ug via INTRAVENOUS
  Administered 2018-02-22: 40 ug via INTRAVENOUS

## 2018-02-22 MED ORDER — METHOCARBAMOL 500 MG PO TABS: 750.0000 mg | ORAL_TABLET | Freq: Four times a day (QID) | ORAL | Status: DC | PRN

## 2018-02-22 MED ORDER — SIMETHICONE 40 MG/0.6ML PO SUSP
40.0000 mg | Freq: Four times a day (QID) | ORAL | Status: DC
  Administered 2018-02-22 – 2018-02-24 (×7): 40 mg via ORAL
  Filled 2018-02-22 (×10): qty 0.6

## 2018-02-22 MED ORDER — IBUPROFEN 200 MG PO TABS
400.0000 mg | ORAL_TABLET | Freq: Four times a day (QID) | ORAL | Status: DC | PRN
  Administered 2018-02-23: 400 mg via ORAL
  Filled 2018-02-22: qty 2

## 2018-02-22 MED ORDER — DEXAMETHASONE SODIUM PHOSPHATE 10 MG/ML IJ SOLN
INTRAMUSCULAR | Status: AC
  Filled 2018-02-22: qty 2

## 2018-02-22 MED ORDER — ACETAMINOPHEN 500 MG PO TABS
1000.0000 mg | ORAL_TABLET | Freq: Three times a day (TID) | ORAL | Status: DC
  Administered 2018-02-22 – 2018-02-24 (×6): 1000 mg via ORAL
  Filled 2018-02-22 (×6): qty 2

## 2018-02-22 MED ORDER — ENOXAPARIN SODIUM 40 MG/0.4ML ~~LOC~~ SOLN
40.0000 mg | SUBCUTANEOUS | Status: DC
  Administered 2018-02-23 – 2018-02-24 (×2): 40 mg via SUBCUTANEOUS
  Filled 2018-02-22 (×2): qty 0.4

## 2018-02-22 MED ORDER — ALBUMIN HUMAN 5 % IV SOLN
INTRAVENOUS | Status: AC
  Filled 2018-02-22: qty 250

## 2018-02-22 MED ORDER — EPHEDRINE 5 MG/ML INJ
INTRAVENOUS | Status: AC
  Filled 2018-02-22: qty 10

## 2018-02-22 MED ORDER — FENTANYL CITRATE (PF) 100 MCG/2ML IJ SOLN
INTRAMUSCULAR | Status: AC
  Filled 2018-02-22: qty 2

## 2018-02-22 MED ORDER — PRIMIDONE 250 MG PO TABS
250.0000 mg | ORAL_TABLET | Freq: Three times a day (TID) | ORAL | Status: DC
  Administered 2018-02-22 – 2018-02-24 (×6): 250 mg via ORAL
  Filled 2018-02-22 (×7): qty 1

## 2018-02-22 MED ORDER — SIMETHICONE 80 MG PO CHEW: 40.0000 mg | CHEWABLE_TABLET | Freq: Four times a day (QID) | ORAL | Status: DC | PRN

## 2018-02-22 MED ORDER — DIPHENHYDRAMINE HCL 12.5 MG/5ML PO ELIX: 12.5000 mg | ORAL_SOLUTION | Freq: Four times a day (QID) | ORAL | Status: DC | PRN

## 2018-02-22 MED ORDER — TRAMADOL HCL 50 MG PO TABS: 50.0000 mg | ORAL_TABLET | Freq: Four times a day (QID) | ORAL | 0 refills | Status: DC | PRN

## 2018-02-22 MED ORDER — BISACODYL 10 MG RE SUPP: 10.0000 mg | Freq: Every day | RECTAL | Status: DC | PRN

## 2018-02-22 MED ORDER — POLYETHYLENE GLYCOL 3350 17 G PO PACK: 17.0000 g | PACK | Freq: Every day | ORAL | Status: DC | PRN

## 2018-02-22 MED ORDER — LIP MEDEX EX OINT
1.0000 "application " | TOPICAL_OINTMENT | Freq: Two times a day (BID) | CUTANEOUS | Status: DC
  Administered 2018-02-22 – 2018-02-24 (×5): 1 via TOPICAL
  Filled 2018-02-22: qty 7

## 2018-02-22 MED ORDER — GABAPENTIN 300 MG PO CAPS
300.0000 mg | ORAL_CAPSULE | Freq: Two times a day (BID) | ORAL | Status: DC
  Administered 2018-02-22 – 2018-02-24 (×4): 300 mg via ORAL
  Filled 2018-02-22 (×5): qty 1

## 2018-02-22 MED ORDER — PROMETHAZINE HCL 25 MG RE SUPP: 25.0000 mg | Freq: Four times a day (QID) | RECTAL | 5 refills | Status: DC | PRN

## 2018-02-22 MED ORDER — CLINDAMYCIN PHOSPHATE 900 MG/50ML IV SOLN
900.0000 mg | INTRAVENOUS | Status: AC
  Administered 2018-02-22: 900 mg via INTRAVENOUS
  Filled 2018-02-22: qty 50

## 2018-02-22 MED ORDER — PROCHLORPERAZINE EDISYLATE 10 MG/2ML IJ SOLN: 5.0000 mg | Freq: Four times a day (QID) | INTRAMUSCULAR | Status: DC | PRN

## 2018-02-22 MED ORDER — SODIUM CHLORIDE 0.9 % IV SOLN
INTRAVENOUS | Status: DC | PRN
  Administered 2018-02-22: 40 ug/min via INTRAVENOUS

## 2018-02-22 MED ORDER — ONDANSETRON HCL 4 MG/2ML IJ SOLN
INTRAMUSCULAR | Status: DC | PRN
  Administered 2018-02-22: 4 mg via INTRAVENOUS

## 2018-02-22 MED ORDER — HYDROCORTISONE 1 % EX CREA: 1.0000 "application " | TOPICAL_CREAM | Freq: Three times a day (TID) | CUTANEOUS | Status: DC | PRN

## 2018-02-22 MED ORDER — HYDROCORTISONE 2.5 % RE CREA: 1.0000 "application " | TOPICAL_CREAM | Freq: Four times a day (QID) | RECTAL | Status: DC | PRN

## 2018-02-22 MED ORDER — ALUM & MAG HYDROXIDE-SIMETH 200-200-20 MG/5ML PO SUSP: 30.0000 mL | Freq: Four times a day (QID) | ORAL | Status: DC | PRN

## 2018-02-22 MED ORDER — MIDAZOLAM HCL 2 MG/2ML IJ SOLN
INTRAMUSCULAR | Status: AC
  Filled 2018-02-22: qty 2

## 2018-02-22 MED ORDER — METOCLOPRAMIDE HCL 5 MG/ML IJ SOLN: 5.0000 mg | Freq: Three times a day (TID) | INTRAMUSCULAR | Status: DC | PRN

## 2018-02-22 MED ORDER — HYDROMORPHONE HCL 1 MG/ML IJ SOLN
0.5000 mg | INTRAMUSCULAR | Status: DC | PRN
  Administered 2018-02-22 (×3): 1 mg via INTRAVENOUS
  Administered 2018-02-23: 2 mg via INTRAVENOUS
  Administered 2018-02-23: 1 mg via INTRAVENOUS
  Administered 2018-02-23: 2 mg via INTRAVENOUS
  Filled 2018-02-22: qty 1
  Filled 2018-02-22: qty 2
  Filled 2018-02-22 (×3): qty 1
  Filled 2018-02-22: qty 2

## 2018-02-22 MED ORDER — ONDANSETRON 4 MG PO TBDP: 4.0000 mg | ORAL_TABLET | Freq: Four times a day (QID) | ORAL | Status: DC | PRN

## 2018-02-22 MED ORDER — SUGAMMADEX SODIUM 200 MG/2ML IV SOLN
INTRAVENOUS | Status: AC
  Filled 2018-02-22: qty 2

## 2018-02-22 MED ORDER — PROPOFOL 500 MG/50ML IV EMUL
INTRAVENOUS | Status: DC | PRN
  Administered 2018-02-22: 20 ug/kg/min via INTRAVENOUS

## 2018-02-22 MED ORDER — PHENOL 1.4 % MT LIQD: 1.0000 | OROMUCOSAL | Status: DC | PRN

## 2018-02-22 MED ORDER — SODIUM CHLORIDE 0.9 % IV SOLN
INTRAVENOUS | Status: DC
  Administered 2018-02-22 – 2018-02-23 (×2): via INTRAVENOUS

## 2018-02-22 MED ORDER — METHOCARBAMOL 1000 MG/10ML IJ SOLN
500.0000 mg | Freq: Four times a day (QID) | INTRAVENOUS | Status: DC | PRN
  Filled 2018-02-22: qty 5

## 2018-02-22 MED ORDER — METOPROLOL TARTRATE 5 MG/5ML IV SOLN: 5.0000 mg | Freq: Four times a day (QID) | INTRAVENOUS | Status: DC | PRN

## 2018-02-22 MED ORDER — HYDRALAZINE HCL 20 MG/ML IJ SOLN: 5.0000 mg | INTRAMUSCULAR | Status: DC | PRN

## 2018-02-22 MED ORDER — KETAMINE HCL 10 MG/ML IJ SOLN
INTRAMUSCULAR | Status: AC
  Filled 2018-02-22: qty 1

## 2018-02-22 MED ORDER — KETAMINE HCL 10 MG/ML IJ SOLN
INTRAMUSCULAR | Status: DC | PRN
  Administered 2018-02-22: 25 mg via INTRAVENOUS

## 2018-02-22 MED ORDER — ROCURONIUM BROMIDE 100 MG/10ML IV SOLN
INTRAVENOUS | Status: AC
  Filled 2018-02-22: qty 1

## 2018-02-22 MED ORDER — 0.9 % SODIUM CHLORIDE (POUR BTL) OPTIME
TOPICAL | Status: DC | PRN
  Administered 2018-02-22: 1000 mL

## 2018-02-22 MED ORDER — FAMOTIDINE 20 MG PO TABS
40.0000 mg | ORAL_TABLET | Freq: Every day | ORAL | Status: DC
  Administered 2018-02-23 – 2018-02-24 (×2): 40 mg via ORAL
  Filled 2018-02-22 (×2): qty 2

## 2018-02-22 MED ORDER — ALPRAZOLAM 0.5 MG PO TABS
0.5000 mg | ORAL_TABLET | Freq: Three times a day (TID) | ORAL | Status: DC | PRN
  Administered 2018-02-22 – 2018-02-24 (×3): 0.5 mg via ORAL
  Filled 2018-02-22 (×3): qty 1

## 2018-02-22 MED ORDER — FENTANYL CITRATE (PF) 250 MCG/5ML IJ SOLN
INTRAMUSCULAR | Status: AC
  Filled 2018-02-22: qty 5

## 2018-02-22 MED ORDER — CLINDAMYCIN PHOSPHATE 600 MG/50ML IV SOLN
600.0000 mg | Freq: Four times a day (QID) | INTRAVENOUS | Status: AC
  Administered 2018-02-22: 600 mg via INTRAVENOUS
  Filled 2018-02-22 (×2): qty 50

## 2018-02-22 MED ORDER — VITAMIN C 500 MG PO TABS
500.0000 mg | ORAL_TABLET | Freq: Every day | ORAL | Status: DC
  Administered 2018-02-23 – 2018-02-24 (×2): 500 mg via ORAL
  Filled 2018-02-22 (×2): qty 1

## 2018-02-22 MED ORDER — PROMETHAZINE HCL 25 MG/ML IJ SOLN: 6.2500 mg | INTRAMUSCULAR | Status: DC | PRN

## 2018-02-22 MED ORDER — SUCCINYLCHOLINE CHLORIDE 200 MG/10ML IV SOSY
PREFILLED_SYRINGE | INTRAVENOUS | Status: AC
  Filled 2018-02-22: qty 10

## 2018-02-22 MED ORDER — DIPHENHYDRAMINE HCL 50 MG/ML IJ SOLN: 12.5000 mg | Freq: Four times a day (QID) | INTRAMUSCULAR | Status: DC | PRN

## 2018-02-22 MED ORDER — PROCHLORPERAZINE MALEATE 10 MG PO TABS: 10.0000 mg | ORAL_TABLET | Freq: Four times a day (QID) | ORAL | Status: DC | PRN

## 2018-02-22 MED ORDER — LACTATED RINGERS IR SOLN
Status: DC | PRN
  Administered 2018-02-22: 1000 mL

## 2018-02-22 MED ORDER — PHENYLEPHRINE 40 MCG/ML (10ML) SYRINGE FOR IV PUSH (FOR BLOOD PRESSURE SUPPORT)
PREFILLED_SYRINGE | INTRAVENOUS | Status: AC
  Filled 2018-02-22: qty 20

## 2018-02-22 MED ORDER — ALBUMIN HUMAN 5 % IV SOLN
INTRAVENOUS | Status: DC | PRN
  Administered 2018-02-22: 11:00:00 via INTRAVENOUS

## 2018-02-22 MED ORDER — SCOPOLAMINE 1 MG/3DAYS TD PT72
1.0000 | MEDICATED_PATCH | TRANSDERMAL | Status: DC
  Administered 2018-02-22: 1.5 mg via TRANSDERMAL
  Filled 2018-02-22: qty 1

## 2018-02-22 MED ORDER — LIDOCAINE 2% (20 MG/ML) 5 ML SYRINGE
INTRAMUSCULAR | Status: AC
  Filled 2018-02-22: qty 5

## 2018-02-22 MED ORDER — ONDANSETRON HCL 4 MG/2ML IJ SOLN
4.0000 mg | Freq: Four times a day (QID) | INTRAMUSCULAR | Status: DC | PRN
  Administered 2018-02-22 – 2018-02-23 (×2): 4 mg via INTRAVENOUS
  Filled 2018-02-22 (×2): qty 2

## 2018-02-22 MED ORDER — ROCURONIUM BROMIDE 50 MG/5ML IV SOSY
PREFILLED_SYRINGE | INTRAVENOUS | Status: DC | PRN
  Administered 2018-02-22: 10 mg via INTRAVENOUS
  Administered 2018-02-22: 50 mg via INTRAVENOUS
  Administered 2018-02-22 (×3): 10 mg via INTRAVENOUS

## 2018-02-22 MED ORDER — BUPIVACAINE LIPOSOME 1.3 % IJ SUSP
INTRAMUSCULAR | Status: DC | PRN
  Administered 2018-02-22: 20 mL

## 2018-02-22 MED ORDER — LACTATED RINGERS IV SOLN
INTRAVENOUS | Status: DC
  Administered 2018-02-22 (×2): via INTRAVENOUS

## 2018-02-22 MED ORDER — ACETAMINOPHEN 500 MG PO TABS
1000.0000 mg | ORAL_TABLET | ORAL | Status: AC
  Administered 2018-02-22: 1000 mg via ORAL
  Filled 2018-02-22: qty 2

## 2018-02-22 MED ORDER — SUGAMMADEX SODIUM 200 MG/2ML IV SOLN
INTRAVENOUS | Status: DC | PRN
  Administered 2018-02-22: 150 mg via INTRAVENOUS

## 2018-02-22 MED ORDER — LIDOCAINE 2% (20 MG/ML) 5 ML SYRINGE
INTRAMUSCULAR | Status: DC | PRN
  Administered 2018-02-22: 70 mg via INTRAVENOUS

## 2018-02-22 MED ORDER — MIDAZOLAM HCL 5 MG/5ML IJ SOLN
INTRAMUSCULAR | Status: DC | PRN
  Administered 2018-02-22 (×2): 1 mg via INTRAVENOUS

## 2018-02-22 MED ORDER — PROPOFOL 10 MG/ML IV BOLUS
INTRAVENOUS | Status: AC
  Filled 2018-02-22: qty 60

## 2018-02-22 MED ORDER — ONDANSETRON HCL 4 MG PO TABS: 4.0000 mg | ORAL_TABLET | Freq: Three times a day (TID) | ORAL | 5 refills | Status: DC | PRN

## 2018-02-22 MED ORDER — ONDANSETRON HCL 4 MG/2ML IJ SOLN
INTRAMUSCULAR | Status: AC
  Filled 2018-02-22: qty 2

## 2018-02-22 MED ORDER — OXYBUTYNIN CHLORIDE ER 5 MG PO TB24
10.0000 mg | ORAL_TABLET | Freq: Every day | ORAL | Status: DC
  Administered 2018-02-22 – 2018-02-23 (×2): 10 mg via ORAL
  Filled 2018-02-22 (×2): qty 2

## 2018-02-22 MED ORDER — PHENYLEPHRINE HCL 10 MG/ML IJ SOLN
INTRAMUSCULAR | Status: AC
  Filled 2018-02-22: qty 1

## 2018-02-22 MED ORDER — MENTHOL 3 MG MT LOZG: 1.0000 | LOZENGE | OROMUCOSAL | Status: DC | PRN

## 2018-02-22 MED ORDER — FENTANYL CITRATE (PF) 100 MCG/2ML IJ SOLN
25.0000 ug | INTRAMUSCULAR | Status: DC | PRN
  Administered 2018-02-22 (×3): 50 ug via INTRAVENOUS

## 2018-02-22 MED ORDER — BUPIVACAINE-EPINEPHRINE 0.25% -1:200000 IJ SOLN
INTRAMUSCULAR | Status: DC | PRN
  Administered 2018-02-22: 30 mL

## 2018-02-22 MED ORDER — LACTATED RINGERS IV SOLN: 1000.0000 mL | Freq: Three times a day (TID) | INTRAVENOUS | Status: DC | PRN

## 2018-02-22 MED ORDER — SUCCINYLCHOLINE CHLORIDE 200 MG/10ML IV SOSY
PREFILLED_SYRINGE | INTRAVENOUS | Status: DC | PRN
  Administered 2018-02-22: 140 mg via INTRAVENOUS

## 2018-02-22 MED ORDER — BUPIVACAINE-EPINEPHRINE (PF) 0.25% -1:200000 IJ SOLN
INTRAMUSCULAR | Status: AC
  Filled 2018-02-22: qty 60

## 2018-02-22 SURGICAL SUPPLY — 71 items
APPLIER CLIP 5 13 M/L LIGAMAX5 (MISCELLANEOUS)
APPLIER CLIP ROT 10 11.4 M/L (STAPLE)
BLADE SURG SZ11 CARB STEEL (BLADE) ×3 IMPLANT
CHLORAPREP W/TINT 26ML (MISCELLANEOUS) ×3 IMPLANT
CLIP APPLIE 5 13 M/L LIGAMAX5 (MISCELLANEOUS) IMPLANT
CLIP APPLIE ROT 10 11.4 M/L (STAPLE) IMPLANT
COVER SURGICAL LIGHT HANDLE (MISCELLANEOUS) ×3 IMPLANT
COVER TIP SHEARS 8 DVNC (MISCELLANEOUS) ×1 IMPLANT
COVER TIP SHEARS 8MM DA VINCI (MISCELLANEOUS) ×2
COVER WAND RF STERILE (DRAPES) ×3 IMPLANT
DRAIN CHANNEL 19F RND (DRAIN) ×3 IMPLANT
DRAIN PENROSE 18X1/2 LTX STRL (DRAIN) IMPLANT
DRAPE ARM DVNC X/XI (DISPOSABLE) ×4 IMPLANT
DRAPE COLUMN DVNC XI (DISPOSABLE) ×1 IMPLANT
DRAPE DA VINCI XI ARM (DISPOSABLE) ×8
DRAPE DA VINCI XI COLUMN (DISPOSABLE) ×2
DRAPE WARM FLUID 44X44 (DRAPE) ×3 IMPLANT
DRSG TEGADERM 2-3/8X2-3/4 SM (GAUZE/BANDAGES/DRESSINGS) ×15 IMPLANT
DRSG TEGADERM 4X4.75 (GAUZE/BANDAGES/DRESSINGS) ×3 IMPLANT
ELECT REM PT RETURN 15FT ADLT (MISCELLANEOUS) ×3 IMPLANT
ENDOLOOP SUT PDS II  0 18 (SUTURE)
ENDOLOOP SUT PDS II 0 18 (SUTURE) IMPLANT
EVACUATOR DRAINAGE 10X20 100CC (DRAIN) IMPLANT
EVACUATOR SILICONE 100CC (DRAIN) ×3 IMPLANT
FELT TEFLON 4 X1 (Mesh General) ×3 IMPLANT
GAUZE SPONGE 2X2 8PLY STRL LF (GAUZE/BANDAGES/DRESSINGS) ×1 IMPLANT
GLOVE BIOGEL PI IND STRL 6.5 (GLOVE) ×2 IMPLANT
GLOVE BIOGEL PI INDICATOR 6.5 (GLOVE) ×4
GLOVE ECLIPSE 8.0 STRL XLNG CF (GLOVE) ×6 IMPLANT
GLOVE INDICATOR 8.0 STRL GRN (GLOVE) ×6 IMPLANT
GLOVE SURG SS PI 6.0 STRL IVOR (GLOVE) ×6 IMPLANT
GOWN STRL REUS W/ TWL LRG LVL3 (GOWN DISPOSABLE) ×2 IMPLANT
GOWN STRL REUS W/TWL LRG LVL3 (GOWN DISPOSABLE) ×4
GOWN STRL REUS W/TWL XL LVL3 (GOWN DISPOSABLE) ×6 IMPLANT
IRRIG SUCT STRYKERFLOW 2 WTIP (MISCELLANEOUS) ×3
IRRIGATION SUCT STRKRFLW 2 WTP (MISCELLANEOUS) ×1 IMPLANT
KIT BASIN OR (CUSTOM PROCEDURE TRAY) ×3 IMPLANT
KIT PRC NS LF DISP ENDO (KITS) ×1 IMPLANT
KIT PROCEDURE OLYMPUS (KITS) ×2
MESH PHASIX RESORB RECT 10X15 (Mesh General) ×3 IMPLANT
NEEDLE HYPO 22GX1.5 SAFETY (NEEDLE) ×3 IMPLANT
NEEDLE INSUFFLATION 14GA 120MM (NEEDLE) ×3 IMPLANT
PACK UNIVERSAL I (CUSTOM PROCEDURE TRAY) ×3 IMPLANT
PAD POSITIONING PINK XL (MISCELLANEOUS) ×3 IMPLANT
SCISSORS LAP 5X45 EPIX DISP (ENDOMECHANICALS) IMPLANT
SEAL CANN UNIV 5-8 DVNC XI (MISCELLANEOUS) ×4 IMPLANT
SEAL XI 5MM-8MM UNIVERSAL (MISCELLANEOUS) ×8
SEALER VESSEL DA VINCI XI (MISCELLANEOUS) ×2
SEALER VESSEL EXT DVNC XI (MISCELLANEOUS) ×1 IMPLANT
SOLUTION ELECTROLUBE (MISCELLANEOUS) ×3 IMPLANT
SPONGE DRAIN TRACH 4X4 STRL 2S (GAUZE/BANDAGES/DRESSINGS) ×3 IMPLANT
SPONGE GAUZE 2X2 STER 10/PKG (GAUZE/BANDAGES/DRESSINGS) ×2
SPONGE LAP 18X18 RF (DISPOSABLE) ×3 IMPLANT
SUT ETHIBOND 0 36 GRN (SUTURE) ×6 IMPLANT
SUT ETHIBOND NAB CT1 #1 30IN (SUTURE) ×9 IMPLANT
SUT MNCRL AB 4-0 PS2 18 (SUTURE) ×6 IMPLANT
SUT PROLENE 2 0 SH DA (SUTURE) ×3 IMPLANT
SUT V-LOC BARB 180 2/0GR6 GS22 (SUTURE)
SUT VLOC 180 2-0 9IN GS21 (SUTURE) ×3 IMPLANT
SUTURE V-LC BRB 180 2/0GR6GS22 (SUTURE) IMPLANT
SYR 10ML LL (SYRINGE) ×3 IMPLANT
SYR 20CC LL (SYRINGE) ×3 IMPLANT
TIP INNERVISION DETACH 40FR (MISCELLANEOUS) IMPLANT
TIP INNERVISION DETACH 50FR (MISCELLANEOUS) IMPLANT
TIP INNERVISION DETACH 56FR (MISCELLANEOUS) IMPLANT
TIPS INNERVISION DETACH 40FR (MISCELLANEOUS)
TOWEL OR 17X26 10 PK STRL BLUE (TOWEL DISPOSABLE) ×3 IMPLANT
TOWEL OR NON WOVEN STRL DISP B (DISPOSABLE) ×3 IMPLANT
TROCAR ADV FIXATION 5X100MM (TROCAR) ×3 IMPLANT
TUBING ENDO SMARTCAP (MISCELLANEOUS) ×3 IMPLANT
TUBING INSUFFLATION 10FT LAP (TUBING) ×3 IMPLANT

## 2018-02-22 NOTE — H&P (Signed)
[name] [name] DOB: 05-Oct-1955 Divorced / Language: English / Race: White Female  Patient Care Team: Glenda Chroman, MD as PCP - General (Internal Medicine) Rourk, Cristopher Estimable, MD (Gastroenterology) Michael Boston, MD as Consulting Physician (General Surgery) Danis, Kirke Corin, MD as Consulting Physician (Gastroenterology) Burnell Blanks, MD as Consulting Physician (Cardiology)  ` ` Patient sent for surgical consultation at the request of Dr Loletha Carrow  Chief Complaint: worsening hiatal hernia ` ` The patient is a woman that is struggled with heartburn and reflux for many decades. He has a known hiatal hernia. Was not particularly large a decade ago. Struggles with dysphailatations for ated to a Schatzki's ring. Esophagitis noted as well.o a Schatzki's ring. Esophagitis noted as well.o a Schatzki's ring. Esophagitis noted as well. Has had to adjust her diet. Cannot tolerate meats or breads. She has been on an antiacid medications fodjust the medicationn and reflux. She gets severe reflux and bending over reflux. She gets severe reflux and bending over reflux. She gets severe reflux and bending over, lying flat. She has to geeep virt if she coughs or sneezes.n. She will get reflux if she coughs or sneezes. She is had some unintentional weight loss with this. She had an abdominal hysterectomy. She was evaluated by Dr. Gala Romney for many years. Had treatment there up in [place]. Then wished for second opinion through the [facility] gastroenterology group. Esophagitis and stricturing noted. Antacid medication adjusted. Manometry ordered. Was felt because her hiatal hernia had gotten obviously larger and her symptoms have worsened, consider surgical evaluation and treatment.  No personal nor family history of GI/colon cancer, inflammatory bowel disease, irritable bowel syndrome, allergy such as Celiac Sprue, dietary/dairy problems, colitis, ulcers nor gastritis. No recent  sick contacts/gastroenteritis. No travel outside the country. No changes in diet. No dysphagia to solids or liquids. No significant heartburn or reflux. No hematochezia, hematemesis, coffee ground emesis. No evidence of prior gastric/peptic ulceration. she has some occasional rectal bleeding but nothing too severe. Moves her bowels most days. She has to walk with a cane duues. a lot of lower extremity and back chronic issues. Can walk about 20 minutes she yoes not smoke. She is not a diabetic.es. She does not smoke. She is not a diabetic. He is not on any blood thinners. She does have complaints of postnasal drip that Zyrtec and saline drops somewhat help control. She comes today with her ex-husband.  Cleared by cardiology.  Ready for surgery  (Review of systems as stated in this history (HPI) or in the review of systems. Otherwise all other 12 point ROS are negative) ` ` `   Past Surgical History Illene Regulus, CMA; 12/06/2017 10:17 AM) Appendectomy Hysterectomy (not due to cancer) - Complete  Allergies (Alisha Spillers, CMA; 12/06/2017 10:09 AM) Sulfacetamide *CHEMICALS* Penicillin G Benzathine & Proc *PENICILLINS* Morphine Sulfate *ANALGESICS - OPIOID*  Medication History (Alisha Spillers, CMA; 12/06/2017 10:17 AM) busPIRone HCl (5MG  Tablet, Oral) Active. clonazePAM (0.5MG  Tablet, Oral) Active. traMADol HCl (50MG  Tablet, Oral) Active. Famotidine (40MG  Tablet, Oral) Active. Primidone (250MG  Tablet, Oral) Active. FLUoxetine HCl (10MG  Capsule, Oral) Active. Dexilant (60MG  Capsule DR, Oral) Active. Oxybutynin Chloride ER (10MG  Tablet ER 24HR, Oral) Active. Vitamin C (500MG  Tablet Chewable, Oral) Active. Medications Reconciled  Social History Illene Regulus, CMA; 12/06/2017 10:17 AM) Alcohol use Occasional alcohol use. Caffeine use Carbonated beverages, Tea. No drug use Tobacco use Never smoker.  Family History Illene Regulus, [place];  12/06/2017 10:17 AM) Alcohol Abuse Father. Arthritis Father, Mother, Sister. Cancer  Father, Sister. Depression Mother. Ovarian Cancer Family Members In General. Seizure disorder Family Members In General.     Review of Systems Lars Mage Spillers CMA; 12/06/2017 10:17 AM) General Not Present- Appetite Loss, Chills, Fatigue, Fever, Night Sweats, Weight Gain and Weight Loss. Skin Not Present- Change in Wart/Mole, Dryness, Hives, Jaundice, New Lesions, Non-Healing Wounds, Rash and Ulcer. HEENT Present- Hoarseness. Not Present- Earache, Hearing Loss, Nose Bleed, Oral Ulcers, Ringing in the Ears, Seasonal Allergies, Sinus Pain, Sore Throat, Visual Disturbances, Wears glasses/contact lenses and Yellow Eyes. Gastrointestinal Present- Abdominal Pain, Difficulty Swallowing and Nausea. Not Present- Bloating, Bloody Stool, Change in Bowel Habits, Chronic diarrhea, Constipation, Excessive gas, Gets full quickly at meals, Hemorrhoids, Indigestion, Rectal Pain and Vomiting. Female Genitourinary Not Present- Frequency, Nocturia, Painful Urination, Pelvic Pain and Urgency. Neurological Present- Seizures and Trouble walking. Not Present- Decreased Memory, Fainting, Headaches, Numbness, Tingling, Tremor and Weakness. Psychiatric Present- Anxiety and Frequent crying. Not Present- Bipolar, Change in Sleep Pattern, Depression and Fearful. Hematology Present- Easy Bruising. Not Present- Blood Thinners, Excessive bleeding, Gland problems, HIV and Persistent Infections.  Vitals (Alisha Spillers CMA; 12/06/2017 10:07 AM) 12/06/2017 10:06 AM Weight: 156 lb Height: 63.5in Body Surface Area: 1.75 m Body Mass Index: 27.2 kg/m  Pulse: 102 (Regular)  BP: 142/70 (Sitting, Left Arm, Standard)   BP (!) 162/95 (BP Location: Right Arm)   Pulse (!) 108   Temp 97.8 F (36.6 C) (Oral)   Resp 18   Ht 5\' 2"  (1.575 m)   Wt 68.5 kg   SpO2 100%   BMI 27.62 kg/m     Physical Exam Adin Hector  MD; 12/08/2017 10:22 AM)  General Mental Status-Alert. General Appearance-Not in acute distress, Not Sickly. Orientation-Oriented X3. Hydration-Well hydrated. Voice-Normal.  Integumentary Global Assessment Upon inspection and palpation of skin surfaces of the - Axillae: non-tender, no inflammation or ulceration, no drainage. and Distribution of scalp and body hair is normal. General Characteristics Temperature - normal warmth is noted.  Head and Neck Head-normocephalic, atraumatic with no lesions or palpable masses. Face Global Assessment - atraumatic, no absence of expression. Neck Global Assessment - no abnormal movements, no bruit auscultated on the right, no bruit auscultated on the left, no decreased range of motion, non-tender. Trachea-midline. Thyroid Gland Characteristics - non-tender.  Eye Eyeball - Left-Extraocular movements intact, No Nystagmus. Eyeball - Right-Extraocular movements intact, No Nystagmus. Cornea - Left-No Hazy. Cornea - Right-No Hazy. Sclera/Conjunctiva - Left-No scleral icterus, No Discharge. Sclera/Conjunctiva - Right-No scleral icterus, No Discharge. Pupil - Left-Direct reaction to light normal. Pupil - Right-Direct reaction to light normal.  ENMT Ears Pinna - Left - no drainage observed, no generalized tenderness observed. Right - no drainage observed, no generalized tenderness observed. Nose and Sinuses External Inspection of the Nose - no destructive lesion observed. Inspection of the nares - Left - quiet respiration. Right - quiet respiration. Mouth and Throat Lips - Upper Lip - no fissures observed, no pallor noted. Lower Lip - no fissures observed, no pallor noted. Nasopharynx - no discharge present. Oral Cavity/Oropharynx - Tongue - no dryness observed. Oral Mucosa - no cyanosis observed. Hypopharynx - no evidence of airway distress observed.  Chest and Lung Exam Inspection Movements - Normal and  Symmetrical. Accessory muscles - No use of accessory muscles in breathing. Palpation Palpation of the chest reveals - Non-tender. Auscultation Breath sounds - Normal and Clear.  Cardiovascular Auscultation Rhythm - Regular. Murmurs & Other Heart Sounds - Auscultation of the heart reveals - No Murmurs and No Systolic  Clicks.  Abdomen Inspection Inspection of the abdomen reveals - No Visible peristalsis and No Abnormal pulsations. Umbilicus - No Bleeding, No Urine drainage. Palpation/Percussion Palpation and Percussion of the abdomen reveal - Soft, Non Tender, No Rebound tenderness, No Rigidity (guarding) and No Cutaneous hyperesthesia. Note: Abdomen soft. Not severely distended. No distasis recti. No umbilical or other anterior abdominal wall hernias  Female Genitourinary Sexual Maturity Tanner 5 - Adult hair pattern. Note: No vaginal bleeding nor discharge  Peripheral Vascular Upper Extremity Inspection - Left - No Cyanotic nailbeds, Not Ischemic. Right - No Cyanotic nailbeds, Not Ischemic.  Neurologic Neurologic evaluation reveals -normal attention span and ability to concentrate, able to name objects and repeat phrases. Appropriate fund of knowledge , normal sensation and normal coordination. Mental Status Affect - not angry, not paranoid. Cranial Nerves-Normal Bilaterally. Gait-Normal.  Neuropsychiatric Mental status exam performed with findings of-able to articulate well with normal speech/language, rate, volume and coherence, thought content normal with ability to perform basic computations and apply abstract reasoning and no evidence of hallucinations, delusions, obsessions or homicidal/suicidal ideation.  Musculoskeletal Global Assessment Spine, Ribs and Pelvis - no instability, subluxation or laxity. Right Upper Extremity - no instability, subluxation or laxity.  Lymphatic Head & Neck  General Head & Neck Lymphatics: Bilateral - Description -  No Localized lymphadenopathy. Axillary  General Axillary Region: Bilateral - Description - No Localized lymphadenopathy. Femoral & Inguinal  Generalized Femoral & Inguinal Lymphatics: Left - Description - No Localized lymphadenopathy. Right - Description - No Localized lymphadenopathy.   CLINICAL DATA:  Dysphagia, history of stricture and hiatal hernia repair previously  EXAM: ESOPHOGRAM / BARIUM SWALLOW / BARIUM TABLET STUDY  TECHNIQUE: Combined double contrast and single contrast examination performed using effervescent crystals, thick barium liquid, and thin barium liquid. The patient was observed with fluoroscopy swallowing a 13 mm barium sulphate tablet.  FLUOROSCOPY TIME:  Fluoroscopy Time:  54 second  Radiation Exposure Index (if provided by the fluoroscopic device): 70 mGy  Number of Acquired Spot Images: 0  COMPARISON:  CT abdomen pelvis of 12/12/2014  FINDINGS: A double-contrast study was performed. The mucosa of the esophagus is unremarkable. However there is a persistent stricture of the distal esophagus which does not dilate during the course of the study. No esophageal ulceration or mass is evident. Rapid sequence spot films of the esophagus show the swallowing mechanism to be normal. There is some prominence of the cricopharyngeus muscle noted on the lateral view as well. In addition, there is diminished primary peristaltic wave with somewhat aperistaltic esophagus. The stricture of the distal esophagus again is noted and there is a moderate size hiatal hernia present. There is spontaneous gastroesophageal reflux demonstrated. A barium pill was given at the end of the study which did lodge just above the level of the stricture above the hiatal hernia and did not pass into the stomach indicating a short segment distal esophageal stricture.  IMPRESSION: 1. Moderate size hiatal hernia with spontaneous gastroesophageal reflux. 2. Short segment  distal esophageal stricture. 3. Diminished primary peristaltic wave, i.e. esophageal dysmotility.   Electronically Signed   By: Ivar Drape M.D.   On: 12/15/2017 10:57   Result History   DG Esophagus (Order #[id number]) on 12/15/2017 - Order Result History Report  Result Notes for DG Esophagus   Notes recorded by Michael Boston, MD on 12/15/2017 at 12:30 PM EST (Dr Jonathon Resides) Patient completed ordered test/studies. Results noted below.  Alisha, my CCS MA, please call patient to give results and  relay recommendations.   Esophagram confirms hiatal hernia. Some narrowing just above her hiatal hernia. No other masses or structures. Gastric emptying study was normal, arguing against the need for pyloroplasty.   Most likely will plan robotic hiatal hernia repair with partial Toupet fundoplication given the narrowing and probable dysmotility. If manometry is abnormal, that would confirm the need for only a partial wrap as well. Adin Hector, M.D., F.A.C.S. Gastrointestinal and Minimally Invasive Surgery [facility] [address], [place], [place] [address] [phone] Main / Paging    Manometry w no dysmotility   Assessment & Plan Adin Hector MD; 12/08/2017 10:57 AM)  PARAESOPHAGEAL HIATAL HERNIA (K44.9) Impression: Severe heartburn and reflux and esophagitis with chronic stricturing requiring dilatations. Question of Schatxki ring versus reflux scarring.  Enlarging hiatal hernia compared to prior study/scans many years ago.  She is reaching a point where her dysphagia is worsening, and heartburn medications are no longer working.  I think she would benefit from hiatal hernia repair. Minimally invasive/robotic approach. At least partial fundoplication.  No evidence of delayed gastric emptying, so no need for pyloromyotomy  I think she has pretty good exercise tolerance, but with her difficulty walking and chronic pain  and back issues, I would like cardiac clearance to make sure that her operative risks are assessed.  Evaluated & cleared by cardiology  Follow-up on esophageal manometry showed no dysmotility.    Upper GI/esophagram noted possible stricturing.   May require intraoperative EGD/dilatation again. We will see.  She was convinced she has gallstones but I see no record of that. She gives me no good story of biliary colic. I don't think I would offer cholecystectomy at this time unless someone else knows otherwise.  With her chronic pain and anxiety issues, I cautioned that her recovery will be challenged. However, she is quite miserable and has been trying to manage this without surgery for over a decade. She is interested in proceeding with surgery. She understands she will not be eating normally after this, but I am hopeful that she can get to a better place with our help.  The anatomy & physiology of the foregut and anti-reflux mechanism was discussed. The pathophysiology of hiatal herniation and GERD was discussed. Natural history risks without surgery was discussed. The patient's symptoms are not adequately controlled by medicines and other non-operative treatments. I feel the risks of no intervention will lead to serious problems that outweigh the operative risks; therefore, I recommended surgery to reduce the hiatal hernia out of the chest and fundoplication to rebuild the anti-reflux valve and control reflux better. Need for a thorough workup to rule out the differential diagnosis and plan treatment was explained. I explained laparoscopic techniques with possible need for an open approach.  Risks such as bleeding, infection, abscess, leak, need for further treatment, heart attack, death, and other risks were discussed. I noted a good likelihood this will help address the problem. Goals of post-operative recovery were discussed as well. Possibility that this will not correct all  symptoms was explained. Post-operative dysphagia, need for short-term liquid & pureed diet, inability to vomit, possibility of reherniation, possible need for medicines to help control symptoms in addition to surgery were discussed. We will work to minimize complications. Educational handouts further explaining the pathology, treatment options, and dysphagia diet was given as well. Questions were answered. The patient expresses understanding & wishes to proceed with surgery.  Adin Hector, MD, FACS, MASCRS Gastrointestinal and Minimally  Invasive Surgery    [address], [place], [place] [address] [phone] Main / Paging [phone] Fax

## 2018-02-22 NOTE — Interval H&P Note (Signed)
History and Physical Interval Note:  02/22/2018 7:19 AM  [name] [name]  has presented today for surgery, with the diagnosis of [place]  The various methods of treatment have been discussed with the patient and family. After consideration of risks, benefits and other options for treatment, the patient has consented to  Procedure(s): XI ROBOTIC REPAIR OF PARAESOPHAGEAL HIATAL HERNIA WITH FUNDOPLICATION (N/A) POSSIBLE ESOPHAGOGASTRODUODENOSCOPY (EGD) (N/A) as a surgical intervention .  The patient's history has been reviewed, patient examined, no change in status, stable for surgery.  I have reviewed the patient's chart and labs.  Questions were answered to the patient's satisfaction.    I have re-reviewed the the patient's records, history, medications, and allergies.  I have re-examined the patient.  I again discussed intraoperative plans and goals of post-operative recovery.  The patient agrees to proceed.  [name] [name]  [birth year] [id number]  Patient Care Team: Glenda Chroman, MD as PCP - General (Internal Medicine) Gala Romney Cristopher Estimable, MD (Gastroenterology) Michael Boston, MD as Consulting Physician (General Surgery) Danis, Kirke Corin, MD as Consulting Physician (Gastroenterology) Burnell Blanks, MD as Consulting Physician (Cardiology)  Patient Active Problem List   Diagnosis Date Noted  . Hiatal hernia   . Dysphagia   . Hepatitis C 12/23/2011  . Lower GI bleed 07/16/2011  . Diverticulosis 07/16/2011  . Neurosis, anxiety 07/16/2011  . Dog bite of toe 07/16/2011  . Melena 09/23/2010  . Incontinence of feces 09/23/2010  . DEPRESSION 05/16/2009  . ALLERGIC RHINITIS, SEASONAL 05/16/2009  . GERD 05/16/2009  . PEPTIC STRICTURE 05/16/2009  . HIATAL HERNIA 05/16/2009  . OSTEOPENIA 05/16/2009  . NAUSEA 05/16/2009  . VOMITING 05/16/2009  . ABDOMINAL PAIN 05/16/2009    Past Medical History:  Diagnosis Date  . Allergy   . Anxiety   . Complication of  anesthesia   . Depression   . Diverticulosis   . Dysphagia   . Epilepsy ([place])   . Family history of adverse reaction to anesthesia    sisters with ponv  . Fecal incontinence    occ  . Fibromyalgia   . Fractures   . GERD (gastroesophageal reflux disease)    Last EGD with 34F dilation by Dr. Gala Romney 07/29/2006  . Hemorrhoids 10/2004   Otherwise normal colonoscopy by Dr. Gala Romney  . Hepatitis C antibody test positive    confirmed positive ab during 07/2011 hospitalization  . Hiatal hernia   . IBS (irritable bowel syndrome)   . Osteoarthritis   . Osteopenia   . Osteoporosis   . Peptic stricture of esophagus    2006  . Polysubstance abuse ([place])    positive UDS for cocaine, etoh abuse  . PONV (postoperative nausea and vomiting)   . Rheumatoid arthritis(714.0)   . Schatzki's ring    multiple dilations  . Seasonal allergies   . Seizures ([place])    last seizure 2009  . Sinus infection    finished antibiotic 02-16-18    Past Surgical History:  Procedure Laterality Date  . APPENDECTOMY    . BACK SURGERY     lower  . COLONOSCOPY  10/08/10   IDP:[id number] diverticulosis otherwise normal colon and rectum  . ESOPHAGEAL MANOMETRY N/A 11/30/2017   Procedure: ESOPHAGEAL MANOMETRY (EM);  Surgeon: Mauri Pole, MD;  Location: [facility];  Service: Endoscopy;  Laterality: N/A;  . ESOPHAGOGASTRODUODENOSCOPY  10/08/10   ERX:VQMGQ hiatal hernia otherwise normal  . EYE SURGERY     right cyst removed  . LAPAROSCOPIC HYSTERECTOMY    .  left thumb surgery    . NOSE SURGERY    . RECTOCELE REPAIR    . S/P Hysterectomy     complete  . Thumb surgery Left   . TUBAL LIGATION      Social History   Socioeconomic History  . Marital status: Divorced    Spouse name: Not on file  . Number of children: 1  . Years of education: Not on file  . Highest education level: Not on file  Occupational History  . Occupation: disability    Employer: NOT EMPLOYED  Social Needs  . Financial resource  strain: Not on file  . Food insecurity:    Worry: Not on file    Inability: Not on file  . Transportation needs:    Medical: Not on file    Non-medical: Not on file  Tobacco Use  . Smoking status: Never Smoker  . Smokeless tobacco: Never Used  . Tobacco comment: quit as a teenager  Substance and Sexual Activity  . Alcohol use: Yes    Comment: 6 beers per week  . Drug use: No    Comment: h/o cocaine in past; none since june 2018  . Sexual activity: Not on file  Lifestyle  . Physical activity:    Days per week: Not on file    Minutes per session: Not on file  . Stress: Not on file  Relationships  . Social connections:    Talks on phone: Not on file    Gets together: Not on file    Attends religious service: Not on file    Active member of club or organization: Not on file    Attends meetings of clubs or organizations: Not on file    Relationship status: Not on file  . Intimate partner violence:    Fear of current or ex partner: Not on file    Emotionally abused: Not on file    Physically abused: Not on file    Forced sexual activity: Not on file  Other Topics Concern  . Not on file  Social History Narrative  . Not on file    Family History  Problem Relation Age of Onset  . Leukemia Father   . Alcohol abuse Father   . Arthritis Father   . Cancer Father   . Alzheimer's disease Mother   . Arthritis Mother   . Depression Mother   . Arthritis Sister   . Diabetes Sister   . Cancer Sister   . Leukemia Sister   . Anesthesia problems Neg Hx   . Hypotension Neg Hx   . Malignant hyperthermia Neg Hx   . Pseudochol deficiency Neg Hx   . Colon cancer Neg Hx   . Esophageal cancer Neg Hx   . Stomach cancer Neg Hx   . Rectal cancer Neg Hx     Facility-Administered Medications Prior to Admission  Medication Dose Route Frequency Provider Last Rate Last Dose  . 0.9 %  sodium chloride infusion  500 mL Intravenous Once Doran Stabler, MD       Medications Prior to  Admission  Medication Sig Dispense Refill Last Dose  . ALPRAZolam (XANAX) 0.5 MG tablet Take 0.5 mg by mouth 3 (three) times daily as needed for anxiety.   02/22/2018 at 0500  . Camphor-Eucalyptus-Menthol (VAPORX BALM EX) Apply 1 application topically as needed (dry skin).   02/21/2018 at Unknown time  . cephALEXin (KEFLEX) 500 MG capsule Take 500 mg by mouth 3 (three) times daily.  02/16/2018  . dexlansoprazole (DEXILANT) 60 MG capsule Take 1 capsule (60 mg total) by mouth daily. 31 capsule 3 02/22/2018 at 0500  . famotidine (PEPCID) 40 MG tablet Take 40 mg by mouth daily.   02/22/2018 at 0500  . ibuprofen (ADVIL,MOTRIN) 200 MG tablet Take 400 mg by mouth every 6 (six) hours as needed for headache or moderate pain.    Past Week at Unknown time  . oxybutynin (DITROPAN-XL) 10 MG 24 hr tablet Take 10 mg by mouth at bedtime.    02/21/2018 at Unknown time  . primidone (MYSOLINE) 250 MG tablet Take 250 mg by mouth 3 (three) times daily.    02/22/2018 at 0500  . vitamin C (ASCORBIC ACID) 500 MG tablet Take 500 mg by mouth daily.    Past Week at Unknown time    Current Facility-Administered Medications  Medication Dose Route Frequency Provider Last Rate Last Dose  . bupivacaine liposome (EXPAREL) 1.3 % injection 266 mg  20 mL Infiltration On Call to OR Michael Boston, MD      . clindamycin (CLEOCIN) IVPB 900 mg  900 mg Intravenous On Call to OR Michael Boston, MD      . gentamicin (GARAMYCIN) 290 mg in dextrose 5 % 100 mL IVPB  5 mg/kg (Adjusted) Intravenous 30 min Pre-Op Michael Boston, MD      . lactated ringers infusion   Intravenous Continuous Suzette Battiest, MD 10 mL/hr at 02/22/18 [phone]    . scopolamine (TRANSDERM-SCOP) 1 MG/3DAYS 1.5 mg  1 patch Transdermal Q72H Duane Boston, MD   1.5 mg at 02/22/18 7026     Allergies  Allergen Reactions  . Morphine And Related Other (See Comments)    Hallucinations  . Penicillins Other (See Comments)    Turned purple with Penicillin shot when younger Did it  involve swelling of the face/tongue/throat, SOB, or low BP? no Did it involve sudden or severe rash/hives, skin peeling, or any reaction on the inside of your mouth or nose? no Did you need to seek medical attention at a hospital or doctor's office? yes When did it last happen?1956 If all above answers are "NO", may proceed with cephalosporin use.   . Sulfonamide Derivatives Other (See Comments)    REACTION: UNKNOWN REACTION    BP (!) 162/95 (BP Location: Right Arm)   Pulse (!) 108   Temp 97.8 F (36.6 C) (Oral)   Resp 18   Ht 5\' 2"  (1.575 m)   Wt 68.5 kg   SpO2 100%   BMI 27.62 kg/m   Labs: No results found for this or any previous visit (from the past 48 hour(s)).  Imaging / Studies: No results found.   Adin Hector, M.D., F.A.C.S. Gastrointestinal and Minimally Invasive Surgery [facility] [address], [place], [place] [address] [phone] Main / Paging  02/22/2018 7:19 AM    Adin Hector

## 2018-02-22 NOTE — Op Note (Signed)
02/22/2018  11:55 AM  PATIENT:  [name] [name]  63 y.o. female  Patient Care Team: Glenda Chroman, MD as PCP - General (Internal Medicine) Gala Romney, Cristopher Estimable, MD (Gastroenterology) Michael Boston, MD as Consulting Physician (General Surgery) Danis, Kirke Corin, MD as Consulting Physician (Gastroenterology) Burnell Blanks, MD as Consulting Physician (Cardiology)  PRE-OPERATIVE DIAGNOSIS:  PARASOPHAGEAL HIATAL HERNIA  POST-OPERATIVE DIAGNOSIS:  PARASOPHAGEAL HIATAL HERNIA   PROCEDURE:    1. ROBOTIC reduction of paraesophageal hiatal hernia 2. Type II mediastinal dissection. 3. Primary repair of hiatal hernia over pledgets.  4. Mesh reinforcement with absorbable Phasix mesh 5. Anterior & posterior gastropexy. 6. Toupet (partial posterior) fundoplication 2 cm over a 56-French bougie  SURGEON:  Adin Hector, MD  ASSIST:  Carlena Hurl, PA-C Leighton Ruff, MD, FACS.  ANESTHESIA:   general  EBL:  Total I/O In: 1250 [I.V.:1000; IV Piggyback:250] Out: 25 [Blood:25]  Delay start of Pharmacological VTE agent (>24hrs) due to surgical blood loss or risk of bleeding:  no  ANESTHESIA: 1. General anesthesia. 2. Local anesthetic in a field block around all port sites.  SPECIMEN:  Mediastinal hernia sac (not sent).  DRAINS:  A 19-French Blake drain goes from the right upper quadrant along the lesser curvature of the stomach into the mediastinum.  COUNTS:  YES  PLAN OF CARE: Admit to inpatient   PATIENT DISPOSITION:  PACU - hemodynamically stable.  INDICATION:   Patient with symptomatic paraesophageal hiatal hernia.  The patient has had extensive work-up & we feel the patient will benefit from repair:  The anatomy & physiology of the foregut and anti-reflux mechanism was discussed.  The pathophysiology of hiatal herniation and GERD was discussed.  Natural history risks without surgery was discussed.   The patient's symptoms are not adequately controlled by medicines and  other non-operative treatments.  I feel the risks of no intervention will lead to serious problems that outweigh the operative risks; therefore, I recommended surgery to reduce the hiatal hernia out of the chest and fundoplication to rebuild the anti-reflux valve and control reflux better.  Need for a thorough workup to rule out the differential diagnosis and plan treatment was explained.  I explained laparoscopic techniques with possible need for an open approach.  Risks such as bleeding, infection, abscess, leak, need for further treatment, heart attack, death, and other risks were discussed.   I noted a good likelihood this will help address the problem.  Goals of post-operative recovery were discussed as well.  Possibility that this will not correct all symptoms was explained.  Post-operative dysphagia, need for short-term liquid & pureed diet, inability to vomit, possibility of reherniation, possible need for medicines to help control symptoms in addition to surgery were discussed.  We will work to minimize complications.   Educational handouts further explaining the pathology, treatment options, and dysphagia diet was given as well.  Questions were answered.  The patient expresses understanding & wishes to proceed with surgery.  OR FINDINGS:   Moderate-sized paraesophageal hiatal hernia with 33% of the stomach in the mediastinum.  There was a 10 x 8 cm hiatal defect.  It is a primary horizontal mattress interrupted repair over pledgets. Mesh reinforcement was used with Mesh was used: Phasix Mesh (a knitted monofilament mesh scaffold using Poly-4-hydroxybutyrate (P4HB), a biologically derived, fully resorbable material)  The patient has a 3 cm Toupet (partial posterior 419 degree) fundoplication.  The patient has had anterior and posterior gastropexy.  DESCRIPTION:   Informed consent was  confirmed.  The patient received IV antibiotics prior to incision.  The underwent general anesthesia without  difficulty.  A Foley catheter sterilely placed.  The patient was positioned in split leg with arms tucked. The abdomen was prepped and draped in the sterile fashion.  Surgical time-out confirmed our plan.  I placed a 5 mm port in the left subcostal region using Varess entry technique with the patient in steep reverse Trendelenburg and left side up.  Entry was clean.  We induced carbon dioxide insufflation.  Camera inspection revealed no injury.  Under direct visualization, I placed extra ports.  I also placed a 5 mm port in the left subxiphoid region under direct visualization.  I removed that and placed an Omega-shaped rigid Nathanson liver retractor to lift the left lateral sector of the liver anteriorly to expose the esophageal hiatus.  This was secured to the bed using the iron man system.  The Xi robot was carefully docked and instruments placed and advanced under direct visualization.  We focused on dissection.  Fair amount of stomach was up into the mediastinum.  Not as much as I suspected but I think it flipped down after insufflation.  However still a large hiatal hernia defect.  We grasped the anterior mediastinal sac at the apex of the crus.  I scored through that and got into the anterior mediastinum.  I was able to free the mediastinal sac from its attachments to the pericardium and bilateral pleura using primarily focused gentle blunt dissection as well as focused vessel sealer dissection.  I transected phrenoesophageal attachments to the inner right crus, preserving a two centimeter cuff of mediastinal sac until I found the base of the crura.  I then came around anteriorly on the left side and freed up the phrenoesophageal attachments of the mediastinal sac on the medial part of the left crus on the superior half.  I did careful mediastinal dissection to free the mediastinal sac.  With that, we could relieve the suction cup affect of the hernia sac and help reduce the stomach back down into the  abdomen, flipped back approriately.    We ligated the short gastrics along the lesser curvature of the stomach about a third the way down and then came up proximally over the fundus.  We released the attachments of the stomach to the retroperitoneum until we were able to connect with the prior dissection on the left crus.  We completed the release of phrenoesophageal attachments to the medial part of the left crus down to its base.  With this, we had circumferential mobilization.    We placed the stomach and esophagus on axial tension.  I then did a Type II mediastinal dissection where I freed the esophagus from its attachments to the aorta, spine, pleura, and pericardium using primarily gentle blunt as well as focused ultrasonic dissection.  We saw the anterior & posterior vagus nerves intact.  We preserved it at all times.  I procedded to dissect about 20 cm proximally into the mediastinum.  With that I could straighten out the esophagus and get 4 cm of intra-abdominal length of the esophagus at a best estimation.  I freed the anterior mediastinal sac off the esophagus & stomach.  We saw the anterior vagus nerve and freed the sac off of the vagus.  I dissected out & removed the fatty  epiphrenic pads at the esophagogastric junction. With that, I could better define the esophagogastric junction.  I confirmed the the patient had 3  cm of intra-abdominal esophageal length off tension.  Because of the broad hiatal hernia diaphragmatic defect, decided to do a relaxing incision to help bring the crura together better off tension.  I did a relaxing incision along the lateral right crus about 3 cm parallel to the medial part of the crus.  This gave me about 15-20 mm of relaxation so the crura, gather more easily.  I held off on a relaxed incision of the left crus.  I brought the fundus of the stomach posterior to the esophagus over to the right side.  The wrap was mobile with the classic shoe shine maneuver.  Wrap  became together gently.  We reflected the stomach left laterally.  I closed the esophageal hiatus using 0 Ethibond stitch using horizontal mattress stitches with pledgets on both sides.  I did that x3 stitches.  The crura had good substance and they came together well with little tension.  It was snug but not tight, a 1 cm gap around the decompressed esophagus.  Because of the larger defect, I reinforced the repair using a 15 x 10 cm biologic Phasix mesh.  I cut out a 2x4 cm part of mesh in the middle third of the mesh such that the mesh had a broad U shape transversely, one tail 5 cm wide & the other 8cm.  We brought the mesh in and laid it over the crural repair, tails anterior over the crura.  I tucked the broader "U" tail of the mesh between the left diaphragm and the spleen, the narrower "U" tail over the right crus.  's help cover the relaxing incision as well on the right crura of the diaphragm.   I then secured the posterior & anterior corners of the narrower "U" tail with 0 Ethibond upper interrupted suture to the right crus.  I secured to the left lateral and left superior sides of the broader "U" tail to the left diaphragm band with 2-0 V lock running suture.   I brought the fundus of the stomach behind the esophagus and cardia to set up a fundoplication wrap.  I did a posterior gastropexy by taking of #1 Ethibond stitches to the posterior part of the right side of the wrap and thru the Phasix mesh and crural closure.  Did that times 3 interrupted sutures.  I placed a similar stitch on the left anterior side as well.  That way the stomach covered the mesh and protected it from any esophageal exposure.  With the anterior and posterior gastropexy's, stomach laid well for a fundoplication wrap.   Next,  I did a partial posterior 240 degree Toupee fundoplication on the true esophagus above the cardia using Ethibond stitch in the  the medial side of the Phasix mesh & left crus, through the left side of  the  stomach fundoplication left side of the wrap, and anterior esophagus; then, tied that down.   Did a similar barium into the suture on the right side.  Then did 2 more pairs more distally so that there were 3 pairs of sutures creating a 240 degree posterior fundoplication wrap to the anterior esophagus.  Stitches also help to help the stomach be between the mesh and current, protecting the esophagus.  No Phasix mesh exposed to the esophagus.  Measured in the wrap was 3.5 cm at either side.    The wrap was soft and floppy.  I placed a drain through the apex of the diaphragmatic hiatus as noted above.  I did irrigation and ensured hemostasis.  I saw no evidence of any leak or perforation or other abnormality.  I removed the [facility] liver retractor under direct visualization.  I evacuated carbon dioxide and removed the ports.  The skin was closed with Monocryl and sterile dressings applied.  The patient was extubated and brought back to the PACU recovery room.  I discussed postop care in detail with the patient and family in in the office.  Discussed again with the patient in the holding area. I discussed operative findings, updated the patient's status, discussed probable steps to recovery, and gave postoperative recommendations to the Patient's close friend.  Recommendations were made.  Questions were answered.  She expressed understanding & appreciation.  Her friend confessed that the patient has ZERO support otherwise and is rather estranged from family.  We will have Physical & Occupational Therapy along with Case management be involved to figure out what support she needs, and if it is safe to transition home.  Did caution that it is unrealistic to be totally independent right away.  Adin Hector, M.D., F.A.C.S. Gastrointestinal and Minimally Invasive Surgery [facility] [address] [place], [place] [address] [phone] Main /  Paging

## 2018-02-22 NOTE — Anesthesia Procedure Notes (Signed)
Procedure Name: Intubation Date/Time: 02/22/2018 8:34 AM Performed by: Montel Clock, CRNA Pre-anesthesia Checklist: Patient identified, Emergency Drugs available, Suction available, Patient being monitored and Timeout performed Patient Re-evaluated:Patient Re-evaluated prior to induction Oxygen Delivery Method: Circle system utilized Preoxygenation: Pre-oxygenation with 100% oxygen Induction Type: IV induction and Rapid sequence Laryngoscope Size: Mac and 3 Grade View: Grade II Tube type: Oral Tube size: 7.0 mm Number of attempts: 1 Airway Equipment and Method: Stylet Placement Confirmation: ETT inserted through vocal cords under direct vision,  positive ETCO2 and breath sounds checked- equal and bilateral Secured at: 21 cm Tube secured with: Tape Dental Injury: Teeth and Oropharynx as per pre-operative assessment

## 2018-02-22 NOTE — Anesthesia Postprocedure Evaluation (Signed)
Anesthesia Post Note  Patient: [name]  Procedure(s) Performed: XI ROBOTIC REPAIR OF PARAESOPHAGEAL HIATAL HERNIA WITH FUNDOPLICATION (N/A )     Patient location during evaluation: PACU Anesthesia Type: General Level of consciousness: sedated Pain management: pain level controlled Vital Signs Assessment: post-procedure vital signs reviewed and stable Respiratory status: spontaneous breathing and respiratory function stable Cardiovascular status: stable Postop Assessment: no apparent nausea or vomiting Anesthetic complications: no    Last Vitals:  Vitals:   02/22/18 1300 02/22/18 1325  BP: (!) 166/93 (!) 164/87  Pulse: 97 96  Resp: 14 16  Temp: 36.6 C (!) 36.4 C  SpO2: 100% 97%    Last Pain:  Vitals:   02/22/18 1325  TempSrc: Oral  PainSc:                  [name] [name] DANIEL

## 2018-02-22 NOTE — Transfer of Care (Signed)
Immediate Anesthesia Transfer of Care Note  Patient: [name] [name]  Procedure(s) Performed: XI ROBOTIC REPAIR OF PARAESOPHAGEAL HIATAL HERNIA WITH FUNDOPLICATION (N/A )  Patient Location: PACU  Anesthesia Type:General  Level of Consciousness: drowsy and patient cooperative  Airway & Oxygen Therapy: Patient Spontanous Breathing and Patient connected to face mask oxygen  Post-op Assessment: Report given to RN and Post -op Vital signs reviewed and stable  Post vital signs: Reviewed and stable  Last Vitals:  Vitals Value Taken Time  BP 153/85 02/22/2018 12:04 PM  Temp    Pulse 110 02/22/2018 12:07 PM  Resp 16 02/22/2018 12:07 PM  SpO2 100 % 02/22/2018 12:07 PM  Vitals shown include unvalidated device data.  Last Pain:  Vitals:   02/22/18 0658  TempSrc:   PainSc: 10-Worst pain ever      Patients Stated Pain Goal: 9 (44/01/02 7253)  Complications: No apparent anesthesia complications

## 2018-02-22 NOTE — Discharge Instructions (Signed)
EATING AFTER YOUR ESOPHAGEAL SURGERY (Stomach Fundoplication, Hiatal Hernia repair, Achalasia surgery, etc)  ######################################################################  EAT Start with a pureed / full liquid diet (see below) Gradually transition to a high fiber diet with a fiber supplement over the next month after discharge.    WALK Walk an hour a day.  Control your pain to do that.    CONTROL PAIN Control pain so that you can walk, sleep, tolerate sneezing/coughing, go up/down stairs.  HAVE A BOWEL MOVEMENT DAILY Keep your bowels regular to avoid problems.  OK to try a laxative to override constipation.  OK to use an antidairrheal to slow down diarrhea.  Call if not better after 2 tries  CALL IF YOU HAVE PROBLEMS/CONCERNS Call if you are still struggling despite following these instructions. Call if you have concerns not answered by these instructions  ######################################################################   After your esophageal surgery, expect some sticking with swallowing over the next 1-2 months.    If food sticks when you eat, it is called "dysphagia".  This is due to swelling around your esophagus at the wrap & hiatal diaphragm repair.  It will gradually ease off over the next few months.  To help you through this temporary phase, we start you out on a pureed (blenderized) diet.  Your first meal in the hospital was thin liquids.  You should have been given a pureed diet by the time you left the hospital.  We ask patients to stay on a pureed diet for the first 2-3 weeks to avoid anything getting "stuck" near your recent surgery.  Don't be alarmed if your ability to swallow doesn't progress according to this plan.  Everyone is different and some diets can advance more or less quickly.     Some BASIC RULES to follow are:  Maintain an upright position whenever eating or drinking.  Take small bites - just a teaspoon size bite at a time.  Eat slowly.   It may also help to eat only one food at a time.  Consider nibbling through smaller, more frequent meals & avoid the urge to eat BIG meals  Do not push through feelings of fullness, nausea, or bloatedness  Do not mix solid foods and liquids in the same mouthful  Try not to "wash foods down" with large gulps of liquids.  Avoid carbonated (bubbly/fizzy) drinks.    Avoid foods that make you feel gassy or bloated.  Start with bland foods first.  Wait on trying greasy, fried, or spicy meals until you are tolerating more bland solids well.  Understand that it will be hard to burp and belch at first.  This gradually improves with time.  Expect to be more gassy/flatulent/bloated initially.  Walking will help your body manage it better.  Consider using medications for bloating that contain simethicone such as  Maalox or Gas-X   Eat in a relaxed atmosphere & minimize distractions.  Avoid talking while eating.    Do not use straws.  Following each meal, sit in an upright position (90 degree angle) for 60 to 90 minutes.  Going for a short walk can help as well  If food does stick, don't panic.  Try to relax and let the food pass on its own.  Sipping WARM LIQUID such as strong hot black tea can also help slide it down.   Be gradual in changes & use common sense:  -If you easily tolerating a certain "level" of foods, advance to the next level gradually -If you are  having trouble swallowing a particular food, then avoid it.   -If food is sticking when you advance your diet, go back to thinner previous diet (the lower LEVEL) for 1-2 days.  LEVEL 1 = PUREED DIET  Do for the first 2 WEEKS AFTER SURGERY  -Foods in this group are pureed or blenderized to a smooth, mashed potato-like consistency.  -If necessary, the pureed foods can keep their shape with the addition of a thickening agent.   -Meat should be pureed to a smooth, pasty consistency.  Hot broth or gravy may be added to the pureed  meat, approximately 1 oz. of liquid per 3 oz. serving of meat. -CAUTION:  If any foods do not puree into a smooth consistency, swallowing will be more difficult.  (For example, nuts or seeds sometimes do not blend well.)  Hot Foods Cold Foods  Pureed scrambled eggs and cheese Pureed cottage cheese  Baby cereals Thickened juices and nectars  Thinned cooked cereals (no lumps) Thickened milk or eggnog  Pureed [place] toast or pancakes Ensure  Mashed potatoes Ice cream  Pureed parsley, au gratin, scalloped potatoes, candied sweet potatoes Fruit or [place] ice, sherbet  Pureed buttered or alfredo noodles Plain yogurt  Pureed vegetables (no corn or peas) Instant breakfast  Pureed soups and creamed soups Smooth pudding, mousse, custard  Pureed scalloped apples Whipped gelatin  Gravies Sugar, syrup, honey, jelly  Sauces, cheese, tomato, barbecue, white, creamed Cream  Any baby food Creamer  Alcohol in moderation (not beer or champagne) Margarine  Coffee or tea Mayonnaise   Ketchup, mustard   Apple sauce   SAMPLE MENU:  PUREED DIET Breakfast Lunch Dinner   Orange juice, 1/2 cup  Cream of wheat, 1/2 cup  Pineapple juice, 1/2 cup  Pureed [place], barley soup, 3/4 cup  Pureed Hawaiian chicken, 3 oz   Scrambled eggs, mashed or blended with cheese, 1/2 cup  Tea or coffee, 1 cup   Whole milk, 1 cup   Non-dairy creamer, 2 Tbsp.  Mashed potatoes, 1/2 cup  Pureed cooled broccoli, 1/2 cup  Apple sauce, 1/2 cup  Coffee or tea  Mashed potatoes, 1/2 cup  Pureed spinach, 1/2 cup  Frozen yogurt, 1/2 cup  Tea or coffee      LEVEL 2 = SOFT DIET  After your first 2 weeks, you can advance to a soft diet.   Keep on this diet until everything goes down easily.  Hot Foods Cold Foods  White fish Cottage cheese  Stuffed fish Junior baby fruit  Baby food meals Semi thickened juices  Minced soft cooked, scrambled, poached eggs nectars  Souffle & omelets Ripe mashed bananas  Cooked  cereals Canned fruit, pineapple sauce, milk  potatoes Milkshake  Buttered or Alfredo noodles Custard  Cooked cooled vegetable Puddings, including tapioca  Sherbet Yogurt  Vegetable soup or alphabet soup Fruit ice, [place] ice  Gravies Whipped gelatin  Sugar, syrup, honey, jelly Junior baby desserts  Sauces:  Cheese, creamed, barbecue, tomato, white Cream  Coffee or tea Margarine   SAMPLE MENU:  LEVEL 2 Breakfast Lunch Dinner   Orange juice, 1/2 cup  Oatmeal, 1/2 cup  Scrambled eggs with cheese, 1/2 cup  Decaffeinated tea, 1 cup  Whole milk, 1 cup  Non-dairy creamer, 2 Tbsp  Pineapple juice, 1/2 cup  Minced beef, 3 oz  Gravy, 2 Tbsp  Mashed potatoes, 1/2 cup  Minced fresh broccoli, 1/2 cup  Applesauce, 1/2 cup  Coffee, 1 cup  [place], barley soup, 3/4 cup  Minced Hawaiian chicken, 3 oz  Mashed potatoes, 1/2 cup  Cooked spinach, 1/2 cup  Frozen yogurt, 1/2 cup  Non-dairy creamer, 2 Tbsp      LEVEL 3 = CHOPPED DIET  -After all the foods in level 2 (soft diet) are passing through well you should advance up to more chopped foods.  -It is still important to cut these foods into small pieces and eat slowly.  Hot Foods Cold Foods  Poultry Cottage cheese  Chopped Swedish meatballs Yogurt  Meat salads (ground or flaked meat) Milk  Flaked fish (tuna) Milkshakes  Poached or scrambled eggs Soft, cold, dry cereal  Souffles and omelets Fruit juices or nectars  Cooked cereals Chopped canned fruit  Chopped [place] toast or pancakes Canned fruit cocktail  Noodles or pasta (no rice) Pudding, mousse, custard  Cooked vegetables (no frozen peas, corn, or mixed vegetables) Green salad  Canned small sweet peas Ice cream  Creamed soup or vegetable soup Fruit ice, [place] ice  Pureed vegetable soup or alphabet soup Non-dairy creamer  Ground scalloped apples Margarine  Gravies Mayonnaise  Sauces:  Cheese, creamed, barbecue, tomato, white Ketchup  Coffee or tea Mustard    SAMPLE MENU:  LEVEL 3 Breakfast Lunch Dinner   Orange juice, 1/2 cup  Oatmeal, 1/2 cup  Scrambled eggs with cheese, 1/2 cup  Decaffeinated tea, 1 cup  Whole milk, 1 cup  Non-dairy creamer, 2 Tbsp  Ketchup, 1 Tbsp  Margarine, 1 tsp  Salt, 1/4 tsp  Sugar, 2 tsp  Pineapple juice, 1/2 cup  Ground beef, 3 oz  Gravy, 2 Tbsp  Mashed potatoes, 1/2 cup  Cooked spinach, 1/2 cup  Applesauce, 1/2 cup  Decaffeinated coffee  Whole milk  Non-dairy creamer, 2 Tbsp  Margarine, 1 tsp  Salt, 1/4 tsp  Pureed [place], barley soup, 3/4 cup  Barbecue chicken, 3 oz  Mashed potatoes, 1/2 cup  Ground fresh broccoli, 1/2 cup  Frozen yogurt, 1/2 cup  Decaffeinated tea, 1 cup  Non-dairy creamer, 2 Tbsp  Margarine, 1 tsp  Salt, 1/4 tsp  Sugar, 1 tsp    LEVEL 4:  REGULAR FOODS  -Foods in this group are soft, moist, regularly textured foods.   -This level includes meat and breads, which tend to be the hardest things to swallow.   -Eat very slowly, chew well and continue to avoid carbonated drinks. -most people are at this level in 4-6 weeks  Hot Foods Cold Foods  Baked fish or skinned Soft cheeses - cottage cheese  Souffles and omelets Cream cheese  Eggs Yogurt  Stuffed shells Milk  Spaghetti with meat sauce Milkshakes  Cooked cereal Cold dry cereals (no nuts, dried fruit, coconut)  [place] toast or pancakes Crackers  Buttered toast Fruit juices or nectars  Noodles or pasta (no rice) Canned fruit  Potatoes (all types) Ripe bananas  Soft, cooked vegetables (no corn, lima, or baked beans) Peeled, ripe, fresh fruit  Creamed soups or vegetable soup Cakes (no nuts, dried fruit, coconut)  Canned chicken noodle soup Plain doughnuts  Gravies Ice cream  Bacon dressing Pudding, mousse, custard  Sauces:  Cheese, creamed, barbecue, tomato, white Fruit ice, [place] ice, sherbet  Decaffeinated tea or coffee Whipped gelatin  Pork chops Regular gelatin   Canned fruited  gelatin molds   Sugar, syrup, honey, jam, jelly   Cream   Non-dairy   Margarine   Oil   Mayonnaise   Ketchup   Mustard   TROUBLESHOOTING IRREGULAR BOWELS  1) Avoid extremes of bowel  movements (no bad constipation/diarrhea)  2) Miralax 17gm mixed in [place]. water or juice-daily. May use BID as needed.  3) Gas-x,Phazyme, etc. as needed for gas & bloating.  4) Soft,bland diet. No spicy,greasy,fried foods.  5) Prilosec over-the-counter as needed  6) May hold gluten/wheat products from diet to see if symptoms improve.  7) May try probiotics (Align, Activa, etc) to help calm the bowels down  7) If symptoms become worse call back immediately.    If you have any questions please call our office at [place]: [phone].  LAPAROSCOPIC SURGERY: POST OP INSTRUCTIONS  ######################################################################  EAT Gradually transition to a high fiber diet with a fiber supplement over the next few weeks after discharge.  Start with a pureed / full liquid diet (see below)  WALK Walk an hour a day.  Control your pain to do that.    CONTROL PAIN Control pain so that you can walk, sleep, tolerate sneezing/coughing, go up/down stairs.  HAVE A BOWEL MOVEMENT DAILY Keep your bowels regular to avoid problems.  OK to try a laxative to override constipation.  OK to use an antidairrheal to slow down diarrhea.  Call if not better after 2 tries  CALL IF YOU HAVE PROBLEMS/CONCERNS Call if you are still struggling despite following these instructions. Call if you have concerns not answered by these instructions  ######################################################################    1. DIET: Follow a light bland diet the first 24 hours after arrival home, such as soup, liquids, crackers, etc.  Be sure to include lots of fluids daily.  Avoid fast food or heavy meals as your are more likely to get nauseated.  Eat a low fat the next few days after  surgery.    2. Take your usually prescribed home medications unless otherwise directed.  3. PAIN CONTROL: a. Pain is best controlled by a usual combination of three different methods TOGETHER: i. Ice/Heat ii. Over the counter pain medication iii. Prescription pain medication b. Most patients will experience some swelling and bruising around the incisions.  Ice packs or heating pads (30-60 minutes up to 6 times a day) will help. Use ice for the first few days to help decrease swelling and bruising, then switch to heat to help relax tight/sore spots and speed recovery.  Some people prefer to use ice alone, heat alone, alternating between ice & heat.  Experiment to what works for you.  Swelling and bruising can take several weeks to resolve.   c. It is helpful to take an over-the-counter pain medication regularly for the first few weeks.  Choose one of the following that works best for you: i. Naproxen (Aleve, etc)  Two 220mg  tabs twice a day ii. Ibuprofen (Advil, etc) Three 200mg  tabs four times a day (every meal & bedtime) iii. Acetaminophen (Tylenol, etc) 500-650mg  four times a day (every meal & bedtime) d. A  prescription for pain medication (such as oxycodone, hydrocodone, tramadol, gabapentin, methocarbamol, etc) should be given to you upon discharge.  Take your pain medication as prescribed.  i. If you are having problems/concerns with the prescription medicine (does not control pain, nausea, vomiting, rash, itching, etc), please call us [phone] to see if we need to switch you to a different pain medicine that will work better for you and/or control your side effect better. ii. If you need a refill on your pain medication, please give [place] 48 hour notice.  contact your pharmacy.  They will contact our office to request authorization. Prescriptions will not  be filled after 5 pm or on week-ends  4. Avoid getting constipated.   a. Between the surgery and the pain medications, it is common to  experience some constipation.   b. Increasing fluid intake and taking a fiber supplement (such as Metamucil, Citrucel, FiberCon, MiraLax, etc) 1-2 times a day regularly will usually help prevent this problem from occurring.   c. A mild laxative (prune juice, Milk of Magnesia, MiraLax, etc) should be taken according to package directions if there are no bowel movements after 48 hours.   5. Watch out for diarrhea.   a. If you have many loose bowel movements, simplify your diet to bland foods & liquids for a few days.   b. Stop any stool softeners and decrease your fiber supplement.   c. Switching to mild anti-diarrheal medications (Kayopectate, Pepto Bismol) can help.   d. If this worsens or does not improve, please call us.  6. Wash / shower every day.  You may shower over the dressings as they are waterproof.  Continue to shower over incision(s) after the dressing is off.  7. Remove your waterproof bandages 5 days after surgery.  You may leave the incision open to air.  You may replace a dressing/Band-Aid to cover the incision for comfort if you wish.   8. ACTIVITIES as tolerated:   a. You may resume regular (light) daily activities beginning the next day--such as daily self-care, walking, climbing stairs--gradually increasing activities as tolerated.  If you can walk 30 minutes without difficulty, it is safe to try more intense activity such as jogging, treadmill, bicycling, low-impact aerobics, swimming, etc. b. Save the most intensive and strenuous activity for last such as sit-ups, heavy lifting, contact sports, etc  Refrain from any heavy lifting or straining until you are off narcotics for pain control.   c. DO NOT PUSH THROUGH PAIN.  Let pain be your guide: If it hurts to do something, don't do it.  Pain is your body warning you to avoid that activity for another week until the pain goes down. d. You may drive when you are no longer taking prescription pain medication, you can comfortably  wear a seatbelt, and you can safely maneuver your car and apply brakes. e. Dennis Bast may have sexual intercourse when it is comfortable.  9. FOLLOW UP in our office a. Please call [facility] at [phone] to set up an appointment to see your surgeon in the office for a follow-up appointment approximately 2-3 weeks after your surgery. b. Make sure that you call for this appointment the day you arrive home to insure a convenient appointment time.  10. IF YOU HAVE DISABILITY OR FAMILY LEAVE FORMS, BRING THEM TO THE OFFICE FOR PROCESSING.  DO NOT GIVE THEM TO YOUR DOCTOR.   WHEN TO CALL us [phone]: 1. Poor pain control 2. Reactions / problems with new medications (rash/itching, nausea, etc)  3. Fever over 101.5 F (38.5 C) 4. Inability to urinate 5. Nausea and/or vomiting 6. Worsening swelling or bruising 7. Continued bleeding from incision. 8. Increased pain, redness, or drainage from the incision   The clinic staff is available to answer your questions during regular business hours (8:30am-5pm).  Please dont hesitate to call and ask to speak to one of our nurses for clinical concerns.   If you have a medical emergency, go to the nearest emergency room or call 911.  A surgeon from [facility] is always on call at the [facility] [address], [place], [place], [place]  [address] ? MAIN: [phone] ? TOLL FREE: [phone] ?  FAX [phone] [email or web address]    Hiatal Hernia  A hiatal hernia occurs when part of the stomach slides above the muscle that separates the abdomen from the chest (diaphragm). A person can be born with a hiatal hernia (congenital), or it may develop over time. In almost all cases of hiatal hernia, only the top part of the stomach pushes through the diaphragm. Many people have a hiatal hernia with no symptoms. The larger the hernia, the more likely it is that you will have symptoms. In some  cases, a hiatal hernia allows stomach acid to flow back into the tube that carries food from your mouth to your stomach (esophagus). This may cause heartburn symptoms. Severe heartburn symptoms may mean that you have developed a condition called gastroesophageal reflux disease (GERD). What are the causes? This condition is caused by a weakness in the opening (hiatus) where the esophagus passes through the diaphragm to attach to the upper part of the stomach. A person may be born with a weakness in the hiatus, or a weakness can develop over time. What increases the risk? This condition is more likely to develop in:  Older people. Age is a major risk factor for a hiatal hernia, especially if you are over the age of 38.  Pregnant women.  People who are overweight.  People who have frequent constipation. What are the signs or symptoms? Symptoms of this condition usually develop in the form of GERD symptoms. Symptoms include:  Heartburn.  Belching.  Indigestion.  Trouble swallowing.  Coughing or wheezing.  Sore throat.  Hoarseness.  Chest pain.  Nausea and vomiting. How is this diagnosed? This condition may be diagnosed during testing for GERD. Tests that may be done include:  X-rays of your stomach or chest.  An upper gastrointestinal (GI) series. This is an X-ray exam of your GI tract that is taken after you swallow a chalky liquid that shows up clearly on the X-ray.  Endoscopy. This is a procedure to look into your stomach using a thin, flexible tube that has a tiny camera and light on the end of it. How is this treated? This condition may be treated by:  Dietary and lifestyle changes to help reduce GERD symptoms.  Medicines. These may include: ? Over-the-counter antacids. ? Medicines that make your stomach empty more quickly. ? Medicines that block the production of stomach acid (H2 blockers). ? Stronger medicines to reduce stomach acid (proton pump  inhibitors).  Surgery to repair the hernia, if other treatments are not helping. If you have no symptoms, you may not need treatment. Follow these instructions at home: Lifestyle and activity  Do not use any products that contain nicotine or tobacco, such as cigarettes and e-cigarettes. If you need help quitting, ask your health care provider.  Try to achieve and maintain a healthy body weight.  Avoid putting pressure on your abdomen. Anything that puts pressure on your abdomen increases the amount of acid that may be pushed up into your esophagus. ? Avoid bending over, especially after eating. ? Raise the head of your bed by putting blocks under the legs. This keeps your head and esophagus higher than your stomach. ? Do not wear tight clothing around your chest or stomach. ? Try not to strain when having a bowel movement, when urinating, or when lifting heavy objects. Eating and drinking  Avoid foods  that can worsen GERD symptoms. These may include: ? Fatty foods, like fried foods. ? Citrus fruits, like oranges or lemon. ? Other foods and drinks that contain acid, like orange juice or tomatoes. ? Spicy food. ? Chocolate.  Eat frequent small meals instead of three large meals a day. This helps prevent your stomach from getting too full. ? Eat slowly. ? Do not lie down right after eating. ? Do not eat 1-2 hours before bed.  Do not drink beverages with caffeine. These include cola, coffee, cocoa, and tea.  Do not drink alcohol. General instructions  Take over-the-counter and prescription medicines only as told by your health care provider.  Keep all follow-up visits as told by your health care provider. This is important. Contact a health care provider if:  Your symptoms are not controlled with medicines or lifestyle changes.  You are having trouble swallowing.  You have coughing or wheezing that will not go away. Get help right away if:  Your pain is getting  worse.  Your pain spreads to your arms, neck, jaw, teeth, or back.  You have shortness of breath.  You sweat for no reason.  You feel sick to your stomach (nauseous) or you vomit.  You vomit blood.  You have bright red blood in your stools.  You have black, tarry stools. This information is not intended to replace advice given to you by your health care provider. Make sure you discuss any questions you have with your health care provider. Document Released: 04/10/2003 Document Revised: 08/23/2016 Document Reviewed: 08/23/2016 Elsevier Interactive Patient Education  2019 [employer].

## 2018-02-23 ENCOUNTER — Inpatient Hospital Stay (HOSPITAL_COMMUNITY): Payer: Medicare Other

## 2018-02-23 MED ORDER — FUROSEMIDE 10 MG/ML IJ SOLN
40.0000 mg | Freq: Once | INTRAMUSCULAR | Status: AC
  Administered 2018-02-23: 40 mg via INTRAVENOUS
  Filled 2018-02-23: qty 4

## 2018-02-23 MED ORDER — DEXAMETHASONE SODIUM PHOSPHATE 4 MG/ML IJ SOLN
4.0000 mg | Freq: Two times a day (BID) | INTRAMUSCULAR | Status: DC
  Administered 2018-02-23 – 2018-02-24 (×3): 4 mg via INTRAVENOUS
  Filled 2018-02-23 (×3): qty 1

## 2018-02-23 MED ORDER — FUROSEMIDE 10 MG/ML IJ SOLN
INTRAMUSCULAR | Status: AC
  Administered 2018-02-23: 40 mg
  Filled 2018-02-23: qty 2

## 2018-02-23 NOTE — Evaluation (Signed)
Physical Therapy Evaluation Patient Details Name: [name] [name] MRN: [id number] DOB: [birth year] Today's Date: 02/23/2018   History of Present Illness  Pt was admitted with incarcerated hiatal hernia; s/p repair and partial fundoplication.  PMH:  anxiety, fibromyalgia and polysubstance abuse  Clinical Impression  Pt admitted as above and presenting with functional mobility limitations 2* post op pain and mild ambulatory balance deficits.  Pt should progress to dc home with intermittent assist of family.    Follow Up Recommendations Home health PT    Equipment Recommendations  None recommended by PT    Recommendations for Other Services       Precautions / Restrictions Precautions Precautions: Fall Restrictions Weight Bearing Restrictions: No      Mobility  Bed Mobility Overal bed mobility: Needs Assistance Bed Mobility: Sit to Supine       Sit to supine: Supervision   General bed mobility comments: supervision; cues to sidelie back to bed  Transfers Overall transfer level: Needs assistance Equipment used: Rolling walker (2 wheeled) Transfers: Sit to/from Stand Sit to Stand: Min guard         General transfer comment: for safety  Ambulation/Gait Ambulation/Gait assistance: Min assist;Min guard Gait Distance (Feet): 400 Feet Assistive device: Rolling walker (2 wheeled) Gait Pattern/deviations: Step-through pattern;Decreased step length - right;Decreased step length - left;Shuffle;Trunk flexed Gait velocity: too fast   General Gait Details: cues for posture, position from RW and to decrease pace for SOB  Stairs            Wheelchair Mobility    Modified Rankin (Stroke Patients Only)       Balance Overall balance assessment: Mild deficits observed, not formally tested                                           Pertinent Vitals/Pain Pain Assessment: 0-10 Pain Score: 7  Pain Location: stomach Pain Descriptors / Indicators:  Aching;Sore Pain Intervention(s): Limited activity within patient's tolerance;Monitored during session;Premedicated before session    [place] expects to be discharged to:: Private residence Living Arrangements: Alone Available Help at Discharge: Family Type of Home: Apartment Home Access: Level entry     Home Layout: One level Home Equipment: Environmental consultant - 4 wheels;Cane - single point Additional Comments: pt states sister can assist as needed.     Prior Function Level of Independence: Independent;Independent with assistive device(s)         Comments: used cane/4WW as needed     Hand Dominance        Extremity/Trunk Assessment   Upper Extremity Assessment Upper Extremity Assessment: Overall WFL for tasks assessed    Lower Extremity Assessment Lower Extremity Assessment: Overall WFL for tasks assessed       Communication   Communication: No difficulties  Cognition Arousal/Alertness: Awake/alert Behavior During Therapy: WFL for tasks assessed/performed Overall Cognitive Status: No family/caregiver present to determine baseline cognitive functioning                                 General Comments: very talkative and distracts herself. Cues for safety with RW      General Comments      Exercises     Assessment/Plan    PT Assessment Patient needs continued PT services  PT Problem List Decreased strength;Decreased range of motion;Decreased activity tolerance;Decreased  mobility;Decreased knowledge of use of DME;Pain;Decreased safety awareness       PT Treatment Interventions DME instruction;Gait training;Functional mobility training;Therapeutic activities;Therapeutic exercise;Patient/family education    PT Goals (Current goals can be found in the Care Plan section)  Acute Rehab PT Goals Patient Stated Goal: home PT Goal Formulation: With patient Time For Goal Achievement: 03/02/18 Potential to Achieve Goals: Good     Frequency Min 3X/week   Barriers to discharge        Co-evaluation PT/OT/SLP Co-Evaluation/Treatment: Yes Reason for Co-Treatment: For patient/therapist safety PT goals addressed during session: Mobility/safety with mobility OT goals addressed during session: ADL's and self-care       AM-PAC PT "6 Clicks" Mobility  Outcome Measure Help needed turning from your back to your side while in a flat bed without using bedrails?: None Help needed moving from lying on your back to sitting on the side of a flat bed without using bedrails?: A Little Help needed moving to and from a bed to a chair (including a wheelchair)?: A Little Help needed standing up from a chair using your arms (e.g., wheelchair or bedside chair)?: A Little Help needed to walk in hospital room?: A Little Help needed climbing 3-5 steps with a railing? : A Little 6 Click Score: 19    End of Session Equipment Utilized During Treatment: Gait belt Activity Tolerance: Patient tolerated treatment well Patient left: in bed;with call bell/phone within reach;with bed alarm set Nurse Communication: Mobility status PT Visit Diagnosis: Difficulty in walking, not elsewhere classified (R26.2)    Time: 7505-1833 PT Time Calculation (min) (ACUTE ONLY): 15 min   Charges:   PT Evaluation $PT Eval Low Complexity: 1 Low          [place] Pager [phone] Office [phone]   Mazen Marcin 02/23/2018, 4:42 PM

## 2018-02-23 NOTE — Evaluation (Addendum)
Occupational Therapy Evaluation Patient Details Name: [name] [name] MRN: [id number] DOB: 1955-07-29 Today's Date: 02/23/2018    History of Present Illness Pt was admitted with incarcerated hiatal hernia; s/p repair and partial fundoplication.  PMH:  anxiety, fibromyalgia and polysubstance abuse   Clinical Impression   This 63 year old female was admitted for the above. At baseline, she is independent/mod I for adls/iadls.  Pt currently needs min guard assist for safety. She is not willing to go to a SNF for short term rehab. She has a 4# chihuahua at home and states sister will help as needed. Pt tends to do things her own way. Will follow in acute setting.  Recommend HHOT and as much supervision as she will allow initially    Follow Up Recommendations  Home health OT;Supervision - Intermittent(Pt is refusing SNF)    Equipment Recommendations  None recommended by OT(likely none)    Recommendations for Other Services       Precautions / Restrictions Precautions Precautions: Fall Restrictions Weight Bearing Restrictions: No      Mobility Bed Mobility Overal bed mobility: Needs Assistance             General bed mobility comments: supervision; cues to sidelie back to bed  Transfers Overall transfer level: Needs assistance Equipment used: Rolling walker (2 wheeled)(IV pole) Transfers: Sit to/from Stand Sit to Stand: Min guard         General transfer comment: for safety    Balance Overall balance assessment: Mild deficits observed, not formally tested                                         ADL either performed or assessed with clinical judgement   ADL Overall ADL's : Needs assistance/impaired                         Toilet Transfer: Min guard;Ambulation;Comfort height toilet;Grab bars(IV pole for support)             General ADL Comments: pt is able to perform adls with set up; min guard assistance for safety.  Ambulated  to bathroom with min guard for safety.  Pt did not have LOB but was a little unsteady:  tended to hold to door jam, etc when ambulating. Pt able to cross legs for adls     Vision         Perception     Praxis      Pertinent Vitals/Pain Pain Assessment: 0-10 Pain Score: 7 (baseline pain is 6) Pain Location: stomach Pain Descriptors / Indicators: Aching;Sore Pain Intervention(s): Limited activity within patient's tolerance;Monitored during session;Premedicated before session;Repositioned     Hand Dominance     Extremity/Trunk Assessment Upper Extremity Assessment Upper Extremity Assessment: Overall WFL for tasks assessed           Communication Communication Communication: No difficulties(very talkative)   Cognition Arousal/Alertness: Awake/alert Behavior During Therapy: WFL for tasks assessed/performed Overall Cognitive Status: No family/caregiver present to determine baseline cognitive functioning                                 General Comments: very talkative and distracts herself. Cues for safety with RW   General Comments   Dyspnea 2/4. Encouraged stopping to rest and catch breath.    Exercises  Shoulder Instructions      Home Living Family/patient expects to be discharged to:: Private residence Living Arrangements: Alone Available Help at Discharge: Family Type of Home: Apartment             Bathroom Shower/Tub: Teacher, early years/pre: Standard     Home Equipment: Environmental consultant - 4 wheels;Cane - single point   Additional Comments: pt states sister can assist as needed.       Prior Functioning/Environment Level of Independence: Independent;Independent with assistive device(s)        Comments: used cane/4WW as needed        OT Problem List: Decreased activity tolerance;Pain;Decreased knowledge of use of DME or AE      OT Treatment/Interventions: Self-care/ADL training;DME and/or AE instruction;Balance  training;Patient/family education;Therapeutic activities;energy conservation    OT Goals(Current goals can be found in the care plan section) Acute Rehab OT Goals Patient Stated Goal: home OT Goal Formulation: With patient Time For Goal Achievement: 03/09/18 Potential to Achieve Goals: Good ADL Goals Pt Will Transfer to Toilet: (P) with supervision;ambulating;regular height toilet Additional ADL Goal #1: (P) pt will gather clothes at supervision level and complete ADL  OT Frequency: Min 2X/week   Barriers to D/C:            Co-evaluation PT/OT/SLP Co-Evaluation/Treatment: Yes Reason for Co-Treatment: For patient/therapist safety PT goals addressed during session: Mobility/safety with mobility OT goals addressed during session: ADL's and self-care      AM-PAC OT "6 Clicks" Daily Activity     Outcome Measure Help from another person eating meals?: None Help from another person taking care of personal grooming?: A Little Help from another person toileting, which includes using toliet, bedpan, or urinal?: A Little Help from another person bathing (including washing, rinsing, drying)?: A Little Help from another person to put on and taking off regular upper body clothing?: A Little Help from another person to put on and taking off regular lower body clothing?: A Little 6 Click Score: 19   End of Session    Activity Tolerance: Patient tolerated treatment well Patient left: in bed;with call bell/phone within reach;with bed alarm set  OT Visit Diagnosis: Unsteadiness on feet (R26.81)                Time: 5643-3295 OT Time Calculation (min): 19 min Charges:  OT General Charges $OT Visit: 1 Visit OT Evaluation $OT Eval Low Complexity: [place], OTR/L Acute Rehabilitation Services [phone] [facility] pager [phone] office 02/23/2018  [place] 02/23/2018, 2:00 PM

## 2018-02-23 NOTE — Progress Notes (Signed)
OT Cancellation Note  Patient Details Name: [name] [name] MRN: [id number] DOB: 1955-09-04   Cancelled Treatment:    Reason Eval/Treat Not Completed: Pain limiting ability to participate. Checked on pt twice. She felt sleepy and still had pain after IV medication and then groaning in pain. If schedule permits, I will check back later; if not, I will return tomorrow.  [name] 02/23/2018, 10:50 AM  Lesle Chris, OTR/L Acute Rehabilitation Services [phone] [facility] pager [phone] office 02/23/2018

## 2018-02-23 NOTE — Progress Notes (Signed)
[name] [name] [id number] Jan 22, 1956  CARE TEAM:  PCP: Glenda Chroman, MD  Outpatient Care Team: Patient Care Team: Glenda Chroman, MD as PCP - General (Internal Medicine) Gala Romney, Cristopher Estimable, MD (Gastroenterology) Michael Boston, MD as Consulting Physician (General Surgery) Danis, Kirke Corin, MD as Consulting Physician (Gastroenterology) Burnell Blanks, MD as Consulting Physician (Cardiology)  Inpatient Treatment Team: Treatment Team: Attending Provider: Michael Boston, MD; Occupational Therapist: Lesle Chris, OT; Registered Nurse: Kerney Elbe, RN   Problem List:   Principal Problem:   Incarcerated hiatal hernia s/p repair & partial fundoplication 07/29/3149 Active Problems:   Lives alone without help available   ALLERGIC RHINITIS, SEASONAL   GERD s/p Toupet (partial posterior) fundoplication 7/61/6073   Nausea   Neurosis, anxiety   1 Day Post-Op  02/22/2018  POST-OPERATIVE DIAGNOSIS:  PARASOPHAGEAL HIATAL HERNIA   PROCEDURE:    1. ROBOTIC reduction of paraesophageal hiatal hernia 2. Type II mediastinal dissection. 3. Primary repair of hiatal hernia over pledgets.  4. Mesh reinforcement with absorbable Phasix mesh 5. Anterior & posterior gastropexy. 6. Toupet (partial posterior) fundoplication 2 cm over a 56-French bougie  SURGEON:  Adin Hector, MD    Assessment  Somewhat struggling  [facility] Stay = 1 days)  Plan:  Esophagram this morning.  If no evidence of leak or obstruction, ADAT to pureed  Follow off IV fluids.  PRN boluses  Likely remove drain once and if no leak or obstruction.  Pain control.    Anxiolysis.  Mobilize.  Physical and Occupational Therapy evaluations on case managers, valuation to help transition out of hospital if can be safely.  I am highly skeptical that she will be ready to be discharged this afternoon despite doing insurance company thinks.  VTE prophylaxis- SCDs, etc  Mobilize as tolerated  to help recovery  20 minutes spent in review, evaluation, examination, counseling, and coordination of care.  More than 50% of that time was spent in counseling.  02/23/2018    Subjective: (Chief complaint)  Sore.  Trying to get out of bed  Anxious.  She is able to burp and belch.  Anxious.    Worried about going home today.  Objective:  Vital signs:  Vitals:   02/22/18 2151 02/22/18 2152 02/23/18 0318 02/23/18 0602  BP: (!) 171/89 (!) 159/100 (!) 147/79 (!) 152/81  Pulse: 96 94 99 (!) 108  Resp: 16  16 16   Temp: 97.8 F (36.6 C)  98.1 F (36.7 C) 98.2 F (36.8 C)  TempSrc: Oral  Oral Oral  SpO2: 100%  96% 99%  Weight:      Height:        Last BM Date: 02/21/18  Intake/Output   Yesterday:  01/22 0701 - 01/23 0700 In: 3152.6 [P.O.:540; I.V.:2312.7; IV Piggyback:299.9] Out: 1325 [Urine:1300; Blood:25] This shift:  Total I/O In: 1029.7 [P.O.:540; I.V.:489.7] Out: 1300 [Urine:1300]  Bowel function:  Flatus: YES  BM:  No  Drain: Scant clear colorless liquid   Physical Exam:  General: Pt awake/alert/oriented x4 in mild acute distress.  Anxious but consolable Eyes: PERRL, normal EOM.  Sclera clear.  No icterus Neuro: CN II-XII intact w/o focal sensory/motor deficits. Lymph: No head/neck/groin lymphadenopathy Psych:  No delerium/psychosis/paranoia HENT: Normocephalic, Mucus membranes moist.  No thrush Neck: Supple, No tracheal deviation Chest: No chest wall pain w good excursion CV:  Pulses intact.  Regular rhythm MS: Normal AROM mjr joints.  No obvious deformity  Abdomen: Soft.  Mildy distended.  Mildly tender  at incisions only.  No evidence of peritonitis.  No incarcerated hernias.  Ext:  No deformity.  No mjr edema.  No cyanosis Skin: No petechiae / purpura  Results:   Labs: No results found for this or any previous visit (from the past 48 hour(s)).  Imaging / Studies: No results found.  Medications / Allergies: per  chart  Antibiotics: Anti-infectives (From admission, onward)   Start     Dose/Rate Route Frequency Ordered Stop   02/22/18 1400  clindamycin (CLEOCIN) IVPB 600 mg    Note to Pharmacy:  Pharmacy may adjust dosing strength, interval, or rate of medication as needed for optimal therapy for the patient Send with patient on call to the OR.  Anesthesia to complete antibiotic administration <87min prior to incision per [facility].   600 mg 100 mL/hr over 30 Minutes Intravenous Every 6 hours 02/22/18 1331 02/22/18 1617   02/22/18 0645  clindamycin (CLEOCIN) IVPB 900 mg     900 mg 100 mL/hr over 30 Minutes Intravenous On call to O.R. 02/22/18 0340 02/22/18 0845   02/22/18 0600  gentamicin (GARAMYCIN) 290 mg in dextrose 5 % 100 mL IVPB     5 mg/kg  57.5 kg (Adjusted) 107.3 mL/hr over 60 Minutes Intravenous 30 min pre-op 02/21/18 1006 02/22/18 0915        Note: Portions of this report may have been transcribed using voice recognition software. Every effort was made to ensure accuracy; however, inadvertent computerized transcription errors may be present.   Any transcriptional errors that result from this process are unintentional.     Adin Hector, MD, FACS, MASCRS Gastrointestinal and Minimally Invasive Surgery    [address] [place], [place] [address] [phone] Main / Paging [phone] Fax

## 2018-02-23 NOTE — Progress Notes (Signed)
Esophagram a little tight in the wrap but viable.  Liquid diet.  See if can advance to pured.  Drain not doing much.  Will remove.  Diuresis.  Decadron IV x 72 hr  Adin Hector, MD, FACS, MASCRS Gastrointestinal and Minimally Invasive Surgery    [address] [place], [place] [address] [phone] Main / Paging [phone] Fax

## 2018-02-24 NOTE — Care Management (Signed)
Pt offered choice for home health services and [facility] was chosen. [facility] rep alerted of referral. Marney Doctor RN,BSN [phone]

## 2018-02-24 NOTE — Progress Notes (Signed)
Occupational Therapy Treatment Patient Details Name: [name] [name] MRN: [id number] DOB: 1955-07-22 Today's Date: 02/24/2018    History of present illness Pt was admitted with incarcerated hiatal hernia; s/p repair and partial fundoplication.  PMH:  anxiety, fibromyalgia and polysubstance abuse   OT comments  Pt improved from yesterday.  Cues for less stressful ways to perform ADLs. Recommend HHOT for IADLs.  Pt is very independent:  Hopefully they can help with ideas to stay independent but follow precautions (5#s lifting).  Pt has a small chihuahua at home.  Follow Up Recommendations  Home health OT(if pt will accept)    Equipment Recommendations  None recommended by OT    Recommendations for Other Services      Precautions / Restrictions Precautions Precautions: Fall Restrictions Weight Bearing Restrictions: No       Mobility Bed Mobility Overal bed mobility: Modified Independent                Transfers   Equipment used: Rolling walker (2 wheeled)   Sit to Stand: Supervision              Balance                                           ADL either performed or assessed with clinical judgement   ADL Overall ADL's : Needs assistance/impaired                 Upper Body Dressing : Set up   Lower Body Dressing: Set up                 General ADL Comments: cues to cross legs to decrease stress on stomach. Also cued to follow waist band down on pants for same reason.  Pt steadier with RW, although she continues to walk too far back from it:  supervision level. Pt requested to walk in hall.  Trying to move more to expel gas.      Vision       Perception     Praxis      Cognition Arousal/Alertness: Awake/alert Behavior During Therapy: WFL for tasks assessed/performed                                   General Comments: improved safety; pt walks "behind RW" as she is used to [employer].  Cues for  techniques to make ADLs less stressful on sx site        Exercises     Shoulder Instructions       General Comments      Pertinent Vitals/ Pain       Pain Score: 5  Pain Location: stomach Pain Descriptors / Indicators: Sore Pain Intervention(s): Limited activity within patient's tolerance;Monitored during session;Premedicated before session;Repositioned  Home Living                                          Prior Functioning/Environment              Frequency  Min 2X/week        Progress Toward Goals  OT Goals(current goals can now be found in the care plan section)  Progress towards OT goals: Progressing  toward goals     Plan      Co-evaluation                 AM-PAC OT "6 Clicks" Daily Activity     Outcome Measure   Help from another person eating meals?: None Help from another person taking care of personal grooming?: A Little Help from another person toileting, which includes using toliet, bedpan, or urinal?: A Little Help from another person bathing (including washing, rinsing, drying)?: A Little Help from another person to put on and taking off regular upper body clothing?: A Little Help from another person to put on and taking off regular lower body clothing?: A Little 6 Click Score: 19    End of Session    OT Visit Diagnosis: Unsteadiness on feet (R26.81)   Activity Tolerance Patient tolerated treatment well   Patient Left in bed;with call bell/phone within reach;with bed alarm set   Nurse Communication          Time: 6237-6283 OT Time Calculation (min): 17 min  Charges: OT General Charges $OT Visit: 1 Visit OT Treatments $Self Care/Home Management : 8-22 mins  Lesle Chris, OTR/L Acute Rehabilitation Services [phone] [facility] pager [phone] office 02/24/2018   [place] 02/24/2018, 10:37 AM

## 2018-02-24 NOTE — Discharge Summary (Signed)
Physician Discharge Summary    Patient ID: [name] [name] MRN: [id number] DOB/AGE: October 09, 1955  63 y.o.  Patient Care Team: Glenda Chroman, MD as PCP - General (Internal Medicine) Gala Romney Cristopher Estimable, MD (Gastroenterology) Michael Boston, MD as Consulting Physician (General Surgery) Danis, Kirke Corin, MD as Consulting Physician (Gastroenterology) Burnell Blanks, MD as Consulting Physician (Cardiology)  Admit date: 02/22/2018  Discharge date: 02/24/2018  Hospital Stay = 2 days    Discharge Diagnoses:  Principal Problem:   Incarcerated hiatal hernia s/p repair & partial fundoplication 04/01/6008 Active Problems:   Lives alone without help available   ALLERGIC RHINITIS, SEASONAL   GERD s/p Toupet (partial posterior) fundoplication 9/32/3557   Nausea   Neurosis, anxiety   2 Days Post-Op  02/22/2018  POST-OPERATIVE DIAGNOSIS:  PARASOPHAGEAL HIATAL HERNIA   PROCEDURE:    1. ROBOTIC reduction of paraesophageal hiatal hernia 2. Type II mediastinal dissection. 3. Primary repair of hiatal hernia over pledgets.  4. Mesh reinforcement with absorbable Phasix mesh 5. Anterior & posterior gastropexy. 6. Toupet (partial posterior) fundoplication 3.5 cm   SURGEON:  Adin Hector, MD  Consults: None  Hospital Course:   Patient with chronic hiatal hernia with belching dysphasia and severe reflux.  Underwent work-up and felt to benefit from hiatal hernia repair.  The patient underwent  the surgery above.  Postoperatively, the patient gradually mobilized and advanced to a solid diet.  Pain and other symptoms were treated aggressively.    By the time of discharge, the patient was walking well the hallways, eating food, having flatus.  Pain was well-controlled on an oral medications.  Based on meeting discharge criteria and continuing to recover, I felt it was safe for the patient to be discharged from the hospital to further recover with close followup. Postoperative  recommendations were discussed in detail.  Patient was very appreciative of nursing and surgical care.  I again went over instructions.  They are written as well.  Discharged Condition: good  Discharge Exam: Blood pressure (!) 169/83, pulse 99, temperature 98.3 F (36.8 C), temperature source Oral, resp. rate 16, height 5\' 2"  (1.575 m), weight 68.5 kg, SpO2 96 %.  General: Pt awake/alert/oriented x4 in No acute distress Eyes: PERRL, normal EOM.  Sclera clear.  No icterus Neuro: CN II-XII intact w/o focal sensory/motor deficits. Lymph: No head/neck/groin lymphadenopathy Psych:  No delerium/psychosis/paranoia.  Still rambling, interrupting often.  Chatty/talking but joking. HENT: Normocephalic, Mucus membranes moist.  No thrush Neck: Supple, No tracheal deviation Chest: No chest wall pain w good excursion CV:  Pulses intact.  Regular rhythm MS: Normal AROM mjr joints.  No obvious deformity Abdomen: Soft.  Nondistended.  Mild lower abdominal discomfort.  No guarding.  No peritonitis.  Minimal sensitivity tenderness at port sites..  No evidence of peritonitis.  No incarcerated hernias. Ext:  SCDs BLE.  No mjr edema.  No cyanosis Skin: No petechiae / purpura   Disposition:   Follow-up Information    Michael Boston, MD In 3 weeks.   Specialty:  General Surgery Why:  To follow up after your operation, To follow up after your hospital stay Contact information: [place] [address] [phone]           Discharge disposition: 01-Home or Self Care       Discharge Instructions    Call MD for:   Complete by:  As directed    Temperature > 101.21F   Call MD for:   Complete  by:  As directed    Temperature > 101.50F   Call MD for:  extreme fatigue   Complete by:  As directed    Call MD for:  extreme fatigue   Complete by:  As directed    Call MD for:  hives   Complete by:  As directed    Call MD for:  hives   Complete by:  As directed    Call MD  for:  persistant nausea and vomiting   Complete by:  As directed    Call MD for:  persistant nausea and vomiting   Complete by:  As directed    Call MD for:  redness, tenderness, or signs of infection (pain, swelling, redness, odor or green/yellow discharge around incision site)   Complete by:  As directed    Call MD for:  redness, tenderness, or signs of infection (pain, swelling, redness, odor or green/yellow discharge around incision site)   Complete by:  As directed    Call MD for:  severe uncontrolled pain   Complete by:  As directed    Call MD for:  severe uncontrolled pain   Complete by:  As directed    Diet general   Complete by:  As directed    SEE ESOPHAGEAL SURGERY DIET INSTRUCTIONS  We using usually start you out on a pureed (blenderized) diet. Expect some sticking with swallowing over the next 1-2 months.   This is due to swelling around your esophagus at the wrap & hiatal diaphragm repair.  It will gradually ease off over the next few months.   Diet general   Complete by:  As directed    SEE ESOPHAGEAL SURGERY DIET INSTRUCTIONS  We using usually start you out on a pureed (blenderized) diet. Expect some sticking with swallowing over the next 1-2 months.   This is due to swelling around your esophagus at the wrap & hiatal diaphragm repair.  It will gradually ease off over the next few months.   Discharge instructions   Complete by:  As directed    Please see discharge instruction sheets.   Also refer to any handouts/printouts that may have been given from the [facility] surgery office (if you visited [place] there before surgery) Please call our office if you have any questions or concerns [phone]   Discharge instructions   Complete by:  As directed    Please see discharge instruction sheets.   Also refer to any handouts/printouts that may have been given from the [facility] surgery office (if you visited [place] there before surgery) Please call our office if you have any questions or  concerns [phone]   Driving Restrictions   Complete by:  As directed    No driving until off narcotics and can safely swerve away without pain during an emergency   Driving Restrictions   Complete by:  As directed    No driving until off narcotics and can safely swerve away without pain during an emergency   Increase activity slowly   Complete by:  As directed    Increase activity slowly   Complete by:  As directed    Lifting restrictions   Complete by:  As directed    Avoid heavy lifting initially, <20 pounds at first.   Do not push through pain.   You have no specific weight limit: If it hurts to do, DON'T DO IT.    If you feel no pain, you are not injuring anything.  Pain will protect  you from injury.   Coughing and sneezing are far more stressful to your incision than any lifting.   Avoid resuming heavy lifting (>50 pounds) or other intense activity until off all narcotic pain medications.   When want to exercise more, give yourself 2 weeks to gradually get back to full intense exercise/activity.   Lifting restrictions   Complete by:  As directed    Avoid heavy lifting initially, <20 pounds at first.   Do not push through pain.   You have no specific weight limit: If it hurts to do, DON'T DO IT.    If you feel no pain, you are not injuring anything.  Pain will protect you from injury.   Coughing and sneezing are far more stressful to your incision than any lifting.   Avoid resuming heavy lifting (>50 pounds) or other intense activity until off all narcotic pain medications.   When want to exercise more, give yourself 2 weeks to gradually get back to full intense exercise/activity.   May shower / Bathe   Complete by:  As directed    [place].  It is fine for dressings or wounds to be washed/rinsed.  Use gentle soap & water.  This will help the incisions and/or wounds get clean & minimize infection.   May shower / Bathe   Complete by:  As directed    [place].  It is fine for dressings or wounds to be washed/rinsed.  Use gentle soap & water.  This will help the incisions and/or wounds get clean & minimize infection.   May walk up steps   Complete by:  As directed    May walk up steps   Complete by:  As directed    Remove dressing in 72 hours   Complete by:  As directed    You have closed incisions: Shower and bathe over these incisions with soap and water every day.  It is OK to wash over the dressings: they are waterproof. Remove all surgical dressings on postoperative day #3.  You do not need to replace dressings over the closed incisions unless you feel more comfortable with a Band-Aid covering it.   Please call our office [phone] if you have further questions.   Remove dressing in 72 hours   Complete by:  As directed    You have closed incisions: Shower and bathe over these incisions with soap and water every day.  It is OK to wash over the dressings: they are waterproof. Remove all surgical dressings on postoperative day #3.  You do not need to replace dressings over the closed incisions unless you feel more comfortable with a Band-Aid covering it.   Please call our office [phone] if you have further questions.   Sexual Activity Restrictions   Complete by:  As directed    Sexual activity as tolerated.  Do not push through pain.  Pain will protect you from injury.   Sexual Activity Restrictions   Complete by:  As directed    Sexual activity as tolerated.  Do not push through pain.  Pain will protect you from injury.   Walk with assistance   Complete by:  As directed    Walk over an hour a day.  May use a walker/cane/companion to help with balance and stamina.   Walk with assistance   Complete by:  As directed    Walk over an hour a day.  May use a walker/cane/companion to help with balance and  stamina.      Allergies as of 02/24/2018      Reactions   Morphine And Related Other (See Comments)   Hallucinations    Penicillins Other (See Comments)   Turned purple with Penicillin shot when younger Did it involve swelling of the face/tongue/throat, SOB, or low BP? no Did it involve sudden or severe rash/hives, skin peeling, or any reaction on the inside of your mouth or nose? no Did you need to seek medical attention at a hospital or doctor's office? yes When did it last happen?1956 If all above answers are "NO", may proceed with cephalosporin use.   Sulfonamide Derivatives Other (See Comments)   REACTION: UNKNOWN REACTION      Medication List    TAKE these medications   ALPRAZolam 0.5 MG tablet Commonly known as:  XANAX Take 0.5 mg by mouth 3 (three) times daily as needed for anxiety.   dexlansoprazole 60 MG capsule Commonly known as:  DEXILANT Take 1 capsule (60 mg total) by mouth daily.   famotidine 40 MG tablet Commonly known as:  PEPCID Take 40 mg by mouth daily.   ibuprofen 200 MG tablet Commonly known as:  ADVIL,MOTRIN Take 400 mg by mouth every 6 (six) hours as needed for headache or moderate pain.   ondansetron 4 MG tablet Commonly known as:  ZOFRAN Take 1 tablet (4 mg total) by mouth every 8 (eight) hours as needed for nausea.   oxybutynin 10 MG 24 hr tablet Commonly known as:  DITROPAN-XL Take 10 mg by mouth at bedtime.   primidone 250 MG tablet Commonly known as:  MYSOLINE Take 250 mg by mouth 3 (three) times daily.   promethazine 25 MG suppository Commonly known as:  PHENERGAN Place 1 suppository (25 mg total) rectally every 6 (six) hours as needed for nausea.   traMADol 50 MG tablet Commonly known as:  ULTRAM Take 1-2 tablets (50-100 mg total) by mouth every 6 (six) hours as needed for moderate pain or severe pain.   VAPORX BALM EX Apply 1 application topically as needed (dry skin).   vitamin C 500 MG tablet Commonly known as:  ASCORBIC ACID Take 500 mg by mouth daily.       Significant Diagnostic Studies:  No results found for this or any previous  visit (from the past 72 hour(s)).  No results found.  Past Medical History:  Diagnosis Date  . Allergy   . Anxiety   . Complication of anesthesia   . Depression   . Diverticulosis   . Dysphagia   . Epilepsy ([place])   . Family history of adverse reaction to anesthesia    sisters with ponv  . Fecal incontinence    occ  . Fibromyalgia   . Fractures   . GERD (gastroesophageal reflux disease)    Last EGD with 90F dilation by Dr. Gala Romney 07/29/2006  . Hemorrhoids 10/2004   Otherwise normal colonoscopy by Dr. Gala Romney  . Hepatitis C antibody test positive    confirmed positive ab during 07/2011 hospitalization  . Hiatal hernia   . IBS (irritable bowel syndrome)   . Osteoarthritis   . Osteopenia   . Osteoporosis   . Peptic stricture of esophagus    2006  . Polysubstance abuse ([place])    positive UDS for cocaine, etoh abuse  . PONV (postoperative nausea and vomiting)   . Rheumatoid arthritis(714.0)   . Schatzki's ring    multiple dilations  . Seasonal allergies   . Seizures ([place])  last seizure 2009  . Sinus infection    finished antibiotic 02-16-18    Past Surgical History:  Procedure Laterality Date  . APPENDECTOMY    . BACK SURGERY     lower  . COLONOSCOPY  10/08/10   ION:[id number] diverticulosis otherwise normal colon and rectum  . ESOPHAGEAL MANOMETRY N/A 11/30/2017   Procedure: ESOPHAGEAL MANOMETRY (EM);  Surgeon: Mauri Pole, MD;  Location: [facility];  Service: Endoscopy;  Laterality: N/A;  . ESOPHAGOGASTRODUODENOSCOPY  10/08/10   GMW:NUUVO hiatal hernia otherwise normal  . EYE SURGERY     right cyst removed  . LAPAROSCOPIC HYSTERECTOMY    . left thumb surgery    . NOSE SURGERY    . RECTOCELE REPAIR    . S/P Hysterectomy     complete  . Thumb surgery Left   . TUBAL LIGATION      Social History   Socioeconomic History  . Marital status: Divorced    Spouse name: Not on file  . Number of children: 1  . Years of education: Not on file  . Highest  education level: Not on file  Occupational History  . Occupation: disability    Employer: NOT EMPLOYED  Social Needs  . Financial resource strain: Not on file  . Food insecurity:    Worry: Not on file    Inability: Not on file  . Transportation needs:    Medical: Not on file    Non-medical: Not on file  Tobacco Use  . Smoking status: Never Smoker  . Smokeless tobacco: Never Used  . Tobacco comment: quit as a teenager  Substance and Sexual Activity  . Alcohol use: Yes    Comment: 6 beers per week  . Drug use: No    Comment: h/o cocaine in past; none since june 2018  . Sexual activity: Not on file  Lifestyle  . Physical activity:    Days per week: Not on file    Minutes per session: Not on file  . Stress: Not on file  Relationships  . Social connections:    Talks on phone: Not on file    Gets together: Not on file    Attends religious service: Not on file    Active member of club or organization: Not on file    Attends meetings of clubs or organizations: Not on file    Relationship status: Not on file  . Intimate partner violence:    Fear of current or ex partner: Not on file    Emotionally abused: Not on file    Physically abused: Not on file    Forced sexual activity: Not on file  Other Topics Concern  . Not on file  Social History Narrative  . Not on file    Family History  Problem Relation Age of Onset  . Leukemia Father   . Alcohol abuse Father   . Arthritis Father   . Cancer Father   . Alzheimer's disease Mother   . Arthritis Mother   . Depression Mother   . Arthritis Sister   . Diabetes Sister   . Cancer Sister   . Leukemia Sister   . Anesthesia problems Neg Hx   . Hypotension Neg Hx   . Malignant hyperthermia Neg Hx   . Pseudochol deficiency Neg Hx   . Colon cancer Neg Hx   . Esophageal cancer Neg Hx   . Stomach cancer Neg Hx   . Rectal cancer Neg Hx     Current Facility-Administered  Medications  Medication Dose Route Frequency Provider Last  Rate Last Dose  . acetaminophen (TYLENOL) tablet 1,000 mg  1,000 mg Oral Tor Netters, MD   1,000 mg at 02/24/18 0604  . ALPRAZolam Duanne Moron) tablet 0.5 mg  0.5 mg Oral TID PRN Michael Boston, MD   0.5 mg at 02/24/18 0608  . alum & mag hydroxide-simeth (MAALOX/MYLANTA) 200-200-20 MG/5ML suspension 30 mL  30 mL Oral Q6H PRN Michael Boston, MD      . bisacodyl (DULCOLAX) suppository 10 mg  10 mg Rectal Daily PRN Michael Boston, MD      . dexamethasone (DECADRON) injection 4 mg  4 mg Intravenous Gorden Harms, MD   4 mg at 02/23/18 2141  . diphenhydrAMINE (BENADRYL) 12.5 MG/5ML elixir 12.5 mg  12.5 mg Oral Q6H PRN Michael Boston, MD       Or  . diphenhydrAMINE (BENADRYL) injection 12.5 mg  12.5 mg Intravenous Q6H PRN Michael Boston, MD      . enoxaparin (LOVENOX) injection 40 mg  40 mg Subcutaneous Q24H Michael Boston, MD   40 mg at 02/23/18 0826  . famotidine (PEPCID) tablet 40 mg  40 mg Oral Daily Michael Boston, MD   40 mg at 02/23/18 0925  . gabapentin (NEURONTIN) capsule 300 mg  300 mg Oral BID Michael Boston, MD   300 mg at 02/23/18 2140  . guaiFENesin-dextromethorphan (ROBITUSSIN DM) 100-10 MG/5ML syrup 10 mL  10 mL Oral Q4H PRN Michael Boston, MD      . hydrALAZINE (APRESOLINE) injection 5-20 mg  5-20 mg Intravenous Q4H PRN Michael Boston, MD      . hydrocortisone (ANUSOL-HC) 2.5 % rectal cream 1 application  1 application Topical QID PRN Michael Boston, MD      . hydrocortisone cream 1 % 1 application  1 application Topical TID PRN Michael Boston, MD      . HYDROmorphone (DILAUDID) injection 0.5-2 mg  0.5-2 mg Intravenous Q2H PRN Michael Boston, MD   2 mg at 02/23/18 2151  . ibuprofen (ADVIL,MOTRIN) tablet 400 mg  400 mg Oral Q6H PRN Michael Boston, MD   400 mg at 02/23/18 1226  . lactated ringers infusion 1,000 mL  1,000 mL Intravenous Q8H PRN Michael Boston, MD      . lactated ringers infusion   Intravenous Continuous Michael Boston, MD 10 mL/hr at 02/22/18 [phone]    . lip balm (CARMEX) ointment 1  application  1 application Topical BID Michael Boston, MD   1 application at 74/08/14 2141  . magic mouthwash  15 mL Oral QID PRN Michael Boston, MD      . menthol-cetylpyridinium (CEPACOL) lozenge 3 mg  1 lozenge Oral PRN Michael Boston, MD      . methocarbamol (ROBAXIN) 500 mg in dextrose 5 % 50 mL IVPB  500 mg Intravenous Q6H PRN Michael Boston, MD      . methocarbamol (ROBAXIN) tablet 750 mg  750 mg Oral Q6H PRN Michael Boston, MD      . metoCLOPramide (REGLAN) injection 5-10 mg  5-10 mg Intravenous Q8H PRN Michael Boston, MD      . metoprolol tartrate (LOPRESSOR) injection 5 mg  5 mg Intravenous Q6H PRN Michael Boston, MD      . ondansetron (ZOFRAN-ODT) disintegrating tablet 4 mg  4 mg Oral Q6H PRN Michael Boston, MD       Or  . ondansetron [facility]) injection 4 mg  4 mg Intravenous Q6H PRN Michael Boston, MD   4 mg at 02/23/18  1317  . oxybutynin (DITROPAN-XL) 24 hr tablet 10 mg  10 mg Oral Ardeen Fillers, MD   10 mg at 02/23/18 2140  . oxyCODONE (Oxy IR/ROXICODONE) immediate release tablet 5-10 mg  5-10 mg Oral Q4H PRN Michael Boston, MD   10 mg at 02/24/18 0442  . phenol (CHLORASEPTIC) mouth spray 1-2 spray  1-2 spray Mouth/Throat PRN Michael Boston, MD      . polyethylene glycol (MIRALAX / GLYCOLAX) packet 17 g  17 g Oral Daily PRN Michael Boston, MD      . polyethylene glycol (MIRALAX / GLYCOLAX) packet 17 g  17 g Oral BID Michael Boston, MD   17 g at 02/23/18 2141  . primidone (MYSOLINE) tablet 250 mg  250 mg Oral TID Michael Boston, MD   250 mg at 02/23/18 2140  . prochlorperazine (COMPAZINE) tablet 10 mg  10 mg Oral Q6H PRN Michael Boston, MD       Or  . prochlorperazine (COMPAZINE) injection 5-10 mg  5-10 mg Intravenous Q6H PRN Michael Boston, MD      . simethicone (MYLICON) 40 DJ/5.7SV suspension 40 mg  40 mg Oral QID Michael Boston, MD   40 mg at 02/23/18 2140  . simethicone (MYLICON) chewable tablet 40 mg  40 mg Oral Q6H PRN Michael Boston, MD      . vitamin C (ASCORBIC ACID) tablet 500 mg  500  mg Oral Daily Michael Boston, MD   500 mg at 02/23/18 7793  . zolpidem (AMBIEN) tablet 5 mg  5 mg Oral QHS PRN Michael Boston, MD         Allergies  Allergen Reactions  . Morphine And Related Other (See Comments)    Hallucinations  . Penicillins Other (See Comments)    Turned purple with Penicillin shot when younger Did it involve swelling of the face/tongue/throat, SOB, or low BP? no Did it involve sudden or severe rash/hives, skin peeling, or any reaction on the inside of your mouth or nose? no Did you need to seek medical attention at a hospital or doctor's office? yes When did it last happen?1956 If all above answers are "NO", may proceed with cephalosporin use.   . Sulfonamide Derivatives Other (See Comments)    REACTION: UNKNOWN REACTION    Signed: Morton Peters, MD, FACS, MASCRS Gastrointestinal and Minimally Invasive Surgery    [address], [place], [place] [address] [phone] Main / Paging [phone] Fax   02/24/2018, 7:40 AM

## 2018-02-24 NOTE — Progress Notes (Signed)
Writer explained all d/c instructions to patient. Patient had no questions. NT will wheel patient out to the front of the building on wheel chair.

## 2019-02-15 DIAGNOSIS — R569 Unspecified convulsions: Secondary | ICD-10-CM | POA: Diagnosis not present

## 2019-02-15 DIAGNOSIS — D692 Other nonthrombocytopenic purpura: Secondary | ICD-10-CM | POA: Diagnosis not present

## 2019-02-15 DIAGNOSIS — Z6824 Body mass index (BMI) 24.0-24.9, adult: Secondary | ICD-10-CM | POA: Diagnosis not present

## 2019-02-15 DIAGNOSIS — I10 Essential (primary) hypertension: Secondary | ICD-10-CM | POA: Diagnosis not present

## 2019-02-15 DIAGNOSIS — Z299 Encounter for prophylactic measures, unspecified: Secondary | ICD-10-CM | POA: Diagnosis not present

## 2019-02-28 DIAGNOSIS — M069 Rheumatoid arthritis, unspecified: Secondary | ICD-10-CM | POA: Diagnosis not present

## 2019-02-28 DIAGNOSIS — M159 Polyosteoarthritis, unspecified: Secondary | ICD-10-CM | POA: Diagnosis not present

## 2019-02-28 DIAGNOSIS — I10 Essential (primary) hypertension: Secondary | ICD-10-CM | POA: Diagnosis not present

## 2019-02-28 IMAGING — RF DG ESOPHAGUS
5 series · 14 of 20 positions shown · non-contrast
Comparison: CT abdomen pelvis of 12/12/2014

CLINICAL DATA: Dysphagia, history of stricture and hiatal hernia
repair previously

EXAM:
ESOPHOGRAM / BARIUM SWALLOW / BARIUM TABLET STUDY
TECHNIQUE: Combined double contrast and single contrast examination performed
using effervescent crystals, thick barium liquid, and thin barium
liquid. The patient was observed with fluoroscopy swallowing a 13 mm
barium sulphate tablet.
FLUOROSCOPY TIME:  Fluoroscopy Time:  54 second
Radiation Exposure Index (if provided by the fluoroscopic device):
70 mGy
Number of Acquired Spot Images: 0

[Series 1: sequence · 0.28mm/px · 3 of 17 frames shown (1 of 4)]
[frame 3/17]
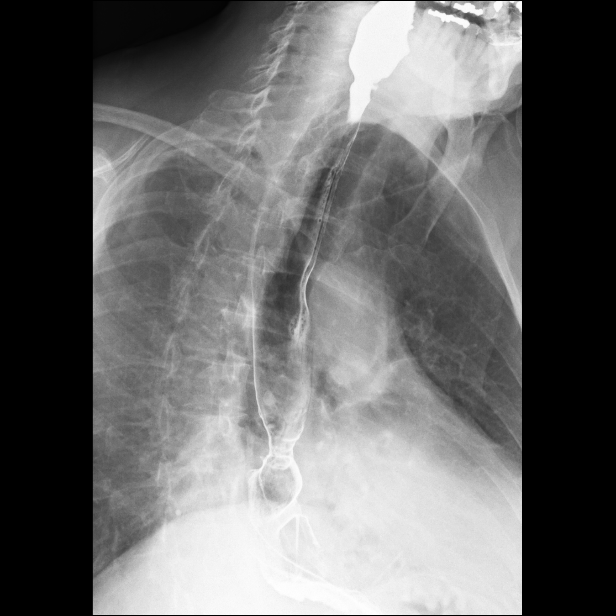
[frame 11/17]
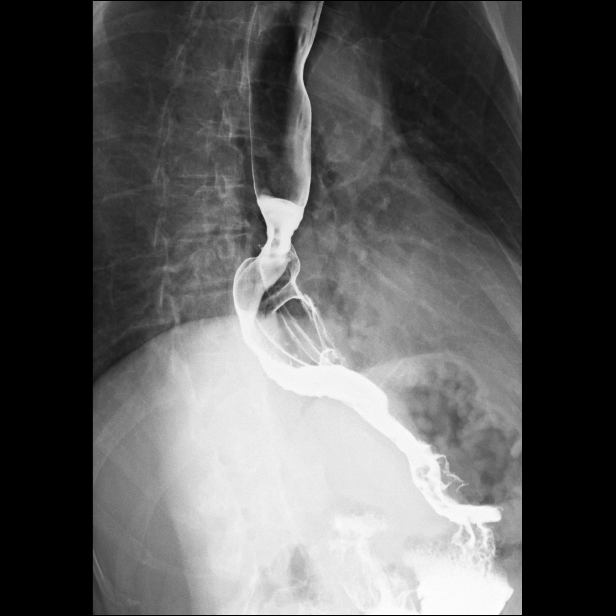
[frame 15/17]
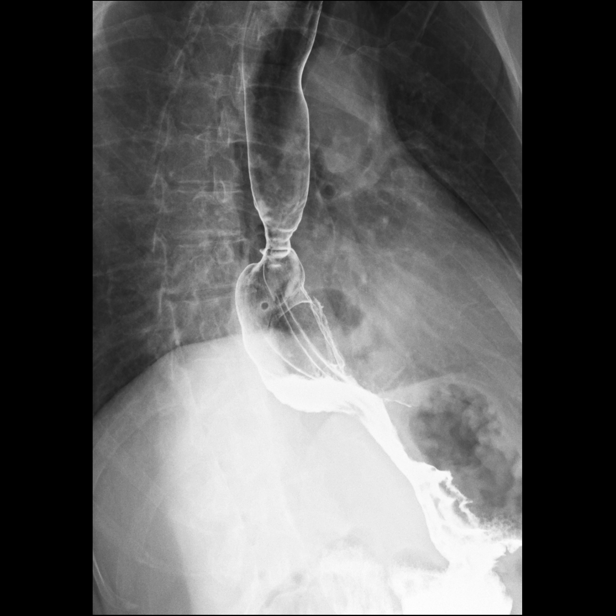

[Series 2: sequence · 0.28mm/px · 3 of 24 frames shown (2 of 4)]
[frame 13/24]
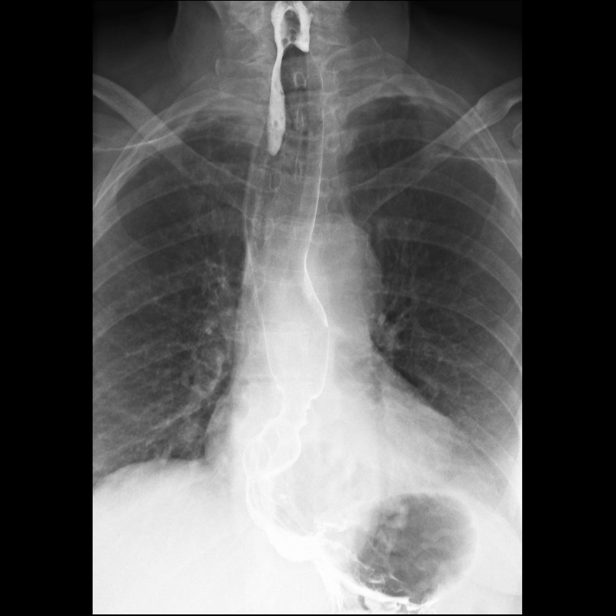
[frame 20/24]
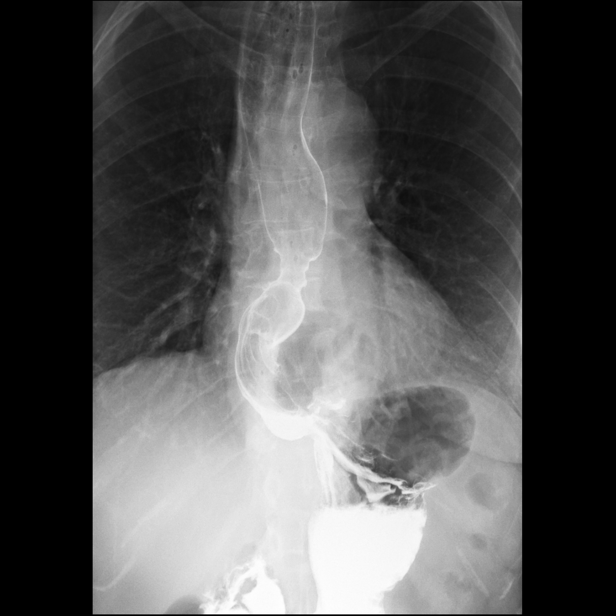
[frame 21/24]
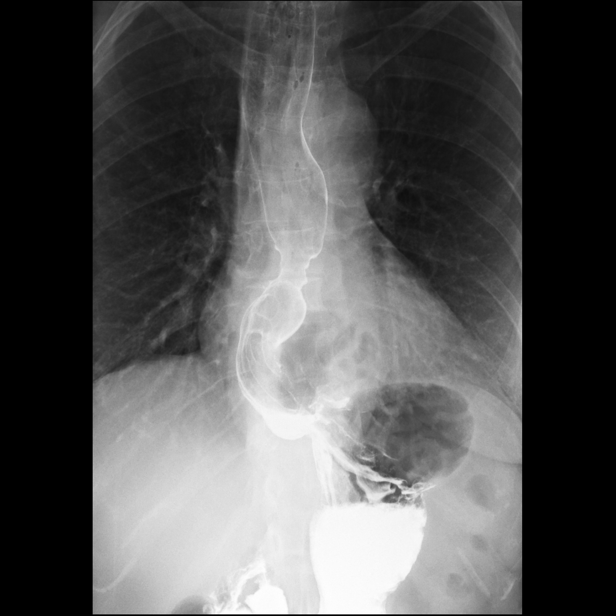

[Series 3: sequence · 0.28mm/px · 2 of 12 frames shown (3 of 4)]
[frame 7/12]
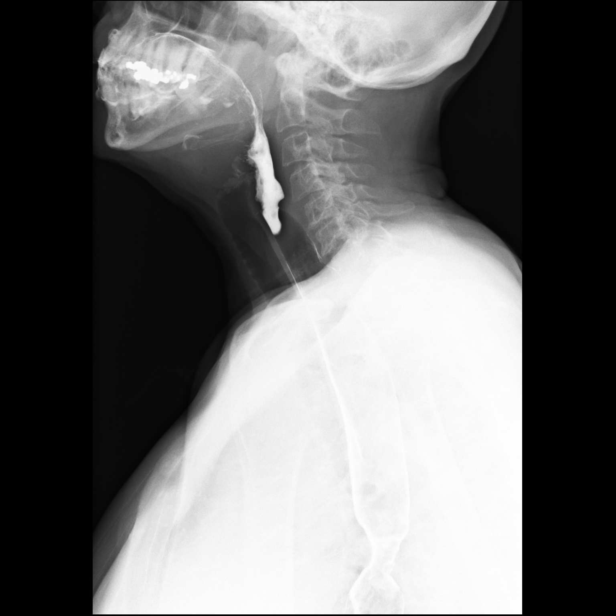
[frame 11/12]
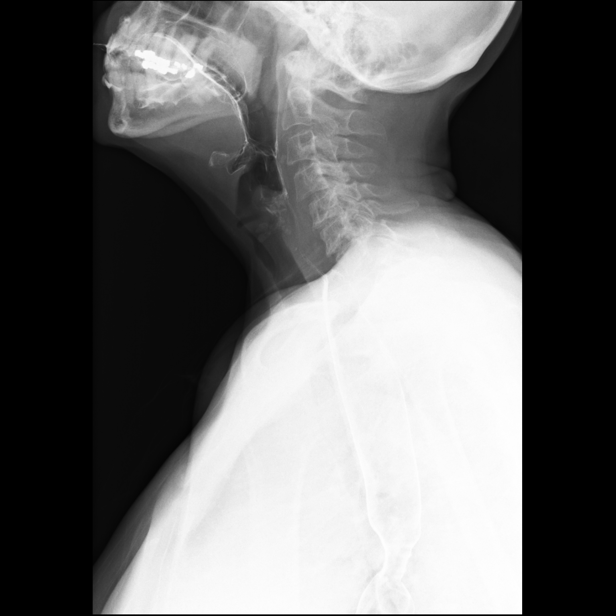

[Series 4: sequence · 0.28mm/px · 2 of 20 frames shown (4 of 4)]
[frame 4/20]
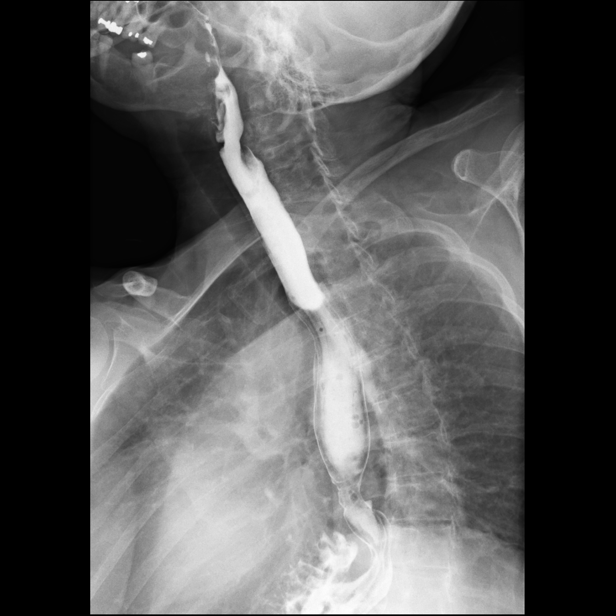
[frame 11/20]
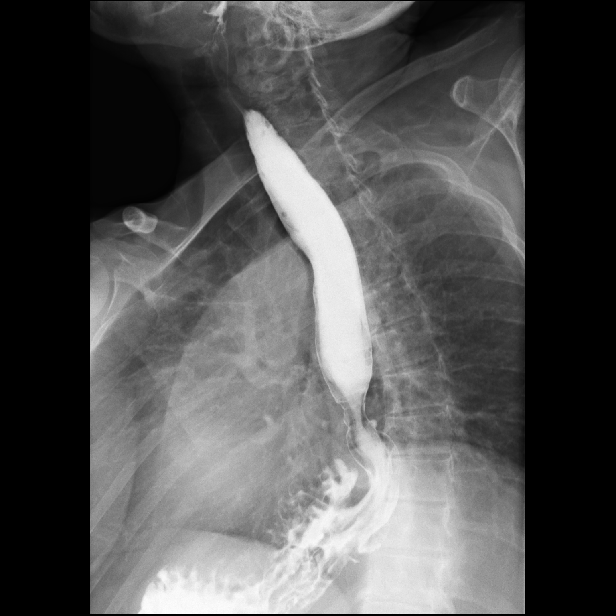

[Series 5: one shot · 0.14mm/px · 4 of 5 slices shown]
[im 1/5]
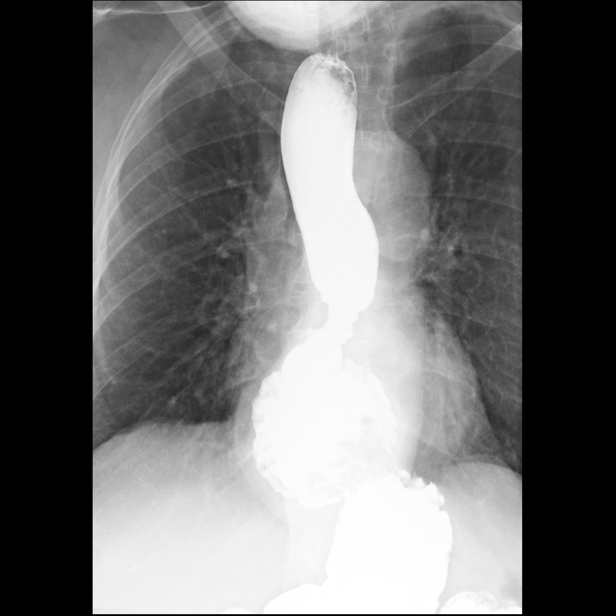
[im 2/5]
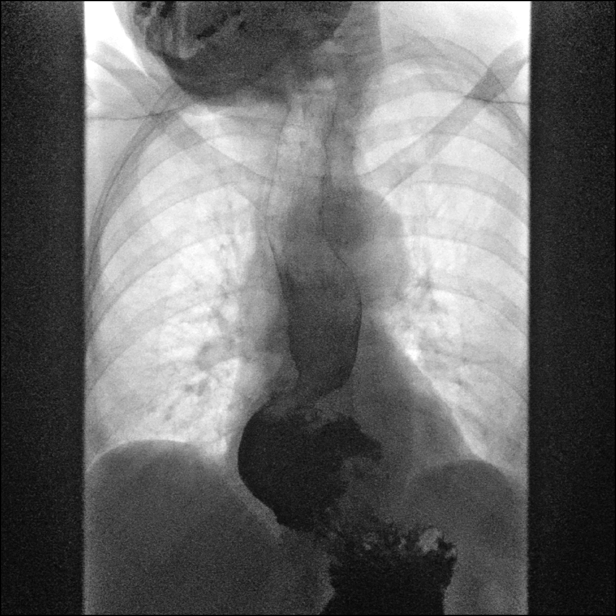
[im 3/5]
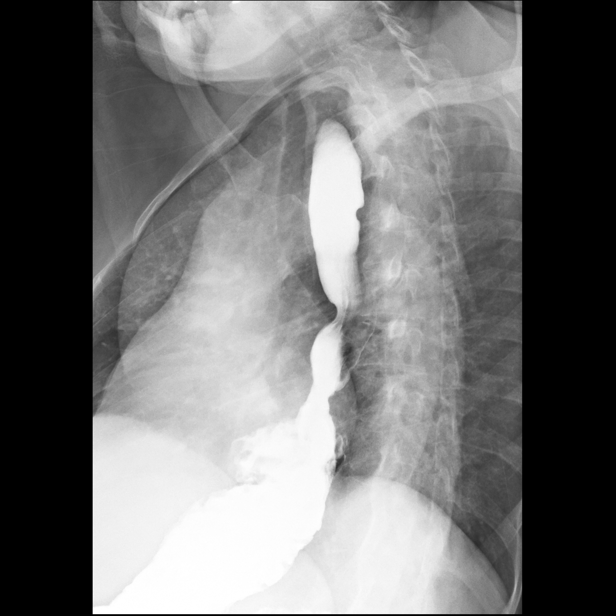
[im 5/5]
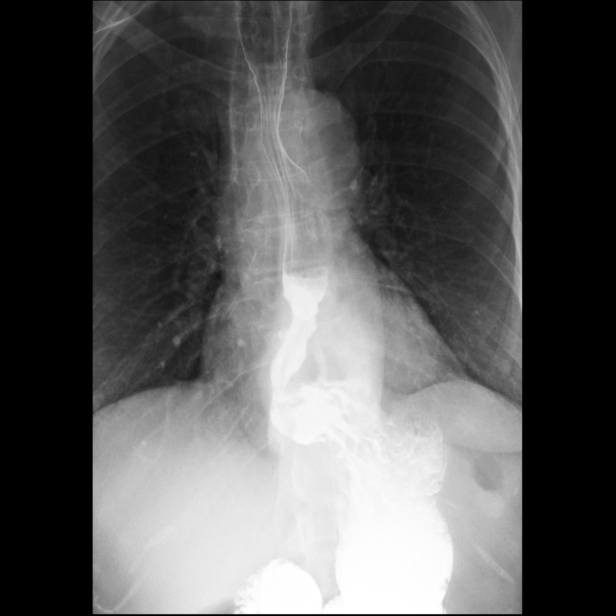

[14 of 20 positions shown; findings below may reference images not displayed]

FINDINGS: A double-contrast study was performed. The mucosa of the esophagus
is unremarkable. However there is a persistent stricture of the
distal esophagus which does not dilate during the course of the
study. No esophageal ulceration or mass is evident. Rapid sequence
spot films of the esophagus show the swallowing mechanism to be
normal. There is some prominence of the cricopharyngeus muscle noted
on the lateral view as well. In addition, there is diminished
primary peristaltic wave with somewhat aperistaltic esophagus. The
stricture of the distal esophagus again is noted and there is a
moderate size hiatal hernia present. There is spontaneous
gastroesophageal reflux demonstrated. A barium pill was given at the
end of the study which did lodge just above the level of the
stricture above the hiatal hernia and did not pass into the stomach
indicating a short segment distal esophageal stricture.
IMPRESSION: 1. Moderate size hiatal hernia with spontaneous gastroesophageal
reflux.
2. Short segment distal esophageal stricture.
3. Diminished primary peristaltic wave, i.e. esophageal dysmotility.

## 2019-03-01 DIAGNOSIS — I10 Essential (primary) hypertension: Secondary | ICD-10-CM | POA: Diagnosis not present

## 2019-03-28 DIAGNOSIS — I10 Essential (primary) hypertension: Secondary | ICD-10-CM | POA: Diagnosis not present

## 2019-03-28 DIAGNOSIS — M069 Rheumatoid arthritis, unspecified: Secondary | ICD-10-CM | POA: Diagnosis not present

## 2019-03-28 DIAGNOSIS — M159 Polyosteoarthritis, unspecified: Secondary | ICD-10-CM | POA: Diagnosis not present

## 2019-04-01 DIAGNOSIS — I10 Essential (primary) hypertension: Secondary | ICD-10-CM | POA: Diagnosis not present

## 2019-04-16 DIAGNOSIS — R6889 Other general symptoms and signs: Secondary | ICD-10-CM | POA: Diagnosis not present

## 2019-04-16 DIAGNOSIS — R569 Unspecified convulsions: Secondary | ICD-10-CM | POA: Diagnosis not present

## 2019-04-16 DIAGNOSIS — K5909 Other constipation: Secondary | ICD-10-CM | POA: Diagnosis not present

## 2019-04-16 DIAGNOSIS — Z299 Encounter for prophylactic measures, unspecified: Secondary | ICD-10-CM | POA: Diagnosis not present

## 2019-04-16 DIAGNOSIS — I10 Essential (primary) hypertension: Secondary | ICD-10-CM | POA: Diagnosis not present

## 2019-04-16 DIAGNOSIS — D492 Neoplasm of unspecified behavior of bone, soft tissue, and skin: Secondary | ICD-10-CM | POA: Diagnosis not present

## 2019-04-26 DIAGNOSIS — M159 Polyosteoarthritis, unspecified: Secondary | ICD-10-CM | POA: Diagnosis not present

## 2019-04-26 DIAGNOSIS — I10 Essential (primary) hypertension: Secondary | ICD-10-CM | POA: Diagnosis not present

## 2019-04-26 DIAGNOSIS — M069 Rheumatoid arthritis, unspecified: Secondary | ICD-10-CM | POA: Diagnosis not present

## 2019-05-01 DIAGNOSIS — I10 Essential (primary) hypertension: Secondary | ICD-10-CM | POA: Diagnosis not present

## 2019-05-15 DIAGNOSIS — Z7189 Other specified counseling: Secondary | ICD-10-CM | POA: Diagnosis not present

## 2019-05-15 DIAGNOSIS — Z0000 Encounter for general adult medical examination without abnormal findings: Secondary | ICD-10-CM | POA: Diagnosis not present

## 2019-05-15 DIAGNOSIS — I10 Essential (primary) hypertension: Secondary | ICD-10-CM | POA: Diagnosis not present

## 2019-05-15 DIAGNOSIS — Z299 Encounter for prophylactic measures, unspecified: Secondary | ICD-10-CM | POA: Diagnosis not present

## 2019-05-24 DIAGNOSIS — M159 Polyosteoarthritis, unspecified: Secondary | ICD-10-CM | POA: Diagnosis not present

## 2019-05-24 DIAGNOSIS — M069 Rheumatoid arthritis, unspecified: Secondary | ICD-10-CM | POA: Diagnosis not present

## 2019-05-24 DIAGNOSIS — I10 Essential (primary) hypertension: Secondary | ICD-10-CM | POA: Diagnosis not present

## 2019-06-01 DIAGNOSIS — I10 Essential (primary) hypertension: Secondary | ICD-10-CM | POA: Diagnosis not present

## 2019-06-14 DIAGNOSIS — K219 Gastro-esophageal reflux disease without esophagitis: Secondary | ICD-10-CM | POA: Diagnosis not present

## 2019-06-14 DIAGNOSIS — R6889 Other general symptoms and signs: Secondary | ICD-10-CM | POA: Diagnosis not present

## 2019-06-14 DIAGNOSIS — R569 Unspecified convulsions: Secondary | ICD-10-CM | POA: Diagnosis not present

## 2019-06-14 DIAGNOSIS — Z299 Encounter for prophylactic measures, unspecified: Secondary | ICD-10-CM | POA: Diagnosis not present

## 2019-06-14 DIAGNOSIS — I10 Essential (primary) hypertension: Secondary | ICD-10-CM | POA: Diagnosis not present

## 2019-07-01 DIAGNOSIS — I10 Essential (primary) hypertension: Secondary | ICD-10-CM | POA: Diagnosis not present

## 2019-07-01 DIAGNOSIS — M159 Polyosteoarthritis, unspecified: Secondary | ICD-10-CM | POA: Diagnosis not present

## 2019-07-01 DIAGNOSIS — M069 Rheumatoid arthritis, unspecified: Secondary | ICD-10-CM | POA: Diagnosis not present

## 2019-07-24 DIAGNOSIS — R6889 Other general symptoms and signs: Secondary | ICD-10-CM | POA: Diagnosis not present

## 2019-08-14 DIAGNOSIS — I10 Essential (primary) hypertension: Secondary | ICD-10-CM | POA: Diagnosis not present

## 2019-08-14 DIAGNOSIS — Z299 Encounter for prophylactic measures, unspecified: Secondary | ICD-10-CM | POA: Diagnosis not present

## 2019-08-14 DIAGNOSIS — R6889 Other general symptoms and signs: Secondary | ICD-10-CM | POA: Diagnosis not present

## 2019-08-14 DIAGNOSIS — R569 Unspecified convulsions: Secondary | ICD-10-CM | POA: Diagnosis not present

## 2019-08-31 DIAGNOSIS — I10 Essential (primary) hypertension: Secondary | ICD-10-CM | POA: Diagnosis not present

## 2019-08-31 DIAGNOSIS — M159 Polyosteoarthritis, unspecified: Secondary | ICD-10-CM | POA: Diagnosis not present

## 2019-08-31 DIAGNOSIS — M069 Rheumatoid arthritis, unspecified: Secondary | ICD-10-CM | POA: Diagnosis not present

## 2019-09-03 DIAGNOSIS — R5383 Other fatigue: Secondary | ICD-10-CM | POA: Diagnosis not present

## 2019-09-03 DIAGNOSIS — Z79899 Other long term (current) drug therapy: Secondary | ICD-10-CM | POA: Diagnosis not present

## 2019-10-02 DIAGNOSIS — M159 Polyosteoarthritis, unspecified: Secondary | ICD-10-CM | POA: Diagnosis not present

## 2019-10-02 DIAGNOSIS — M069 Rheumatoid arthritis, unspecified: Secondary | ICD-10-CM | POA: Diagnosis not present

## 2019-10-02 DIAGNOSIS — I10 Essential (primary) hypertension: Secondary | ICD-10-CM | POA: Diagnosis not present

## 2019-10-15 DIAGNOSIS — Z299 Encounter for prophylactic measures, unspecified: Secondary | ICD-10-CM | POA: Diagnosis not present

## 2019-10-15 DIAGNOSIS — I10 Essential (primary) hypertension: Secondary | ICD-10-CM | POA: Diagnosis not present

## 2019-11-30 DIAGNOSIS — I10 Essential (primary) hypertension: Secondary | ICD-10-CM | POA: Diagnosis not present

## 2019-11-30 DIAGNOSIS — M069 Rheumatoid arthritis, unspecified: Secondary | ICD-10-CM | POA: Diagnosis not present

## 2019-11-30 DIAGNOSIS — M159 Polyosteoarthritis, unspecified: Secondary | ICD-10-CM | POA: Diagnosis not present

## 2019-12-14 DIAGNOSIS — Z299 Encounter for prophylactic measures, unspecified: Secondary | ICD-10-CM | POA: Diagnosis not present

## 2019-12-14 DIAGNOSIS — I10 Essential (primary) hypertension: Secondary | ICD-10-CM | POA: Diagnosis not present

## 2020-01-01 DIAGNOSIS — I10 Essential (primary) hypertension: Secondary | ICD-10-CM | POA: Diagnosis not present

## 2020-01-01 DIAGNOSIS — M159 Polyosteoarthritis, unspecified: Secondary | ICD-10-CM | POA: Diagnosis not present

## 2020-01-01 DIAGNOSIS — M069 Rheumatoid arthritis, unspecified: Secondary | ICD-10-CM | POA: Diagnosis not present

## 2020-01-07 DIAGNOSIS — Z299 Encounter for prophylactic measures, unspecified: Secondary | ICD-10-CM | POA: Diagnosis not present

## 2020-01-07 DIAGNOSIS — J329 Chronic sinusitis, unspecified: Secondary | ICD-10-CM | POA: Diagnosis not present

## 2020-01-07 DIAGNOSIS — I10 Essential (primary) hypertension: Secondary | ICD-10-CM | POA: Diagnosis not present

## 2020-01-07 DIAGNOSIS — F32A Depression, unspecified: Secondary | ICD-10-CM | POA: Diagnosis not present

## 2020-01-09 DIAGNOSIS — N6311 Unspecified lump in the right breast, upper outer quadrant: Secondary | ICD-10-CM | POA: Diagnosis not present

## 2020-01-09 DIAGNOSIS — R928 Other abnormal and inconclusive findings on diagnostic imaging of breast: Secondary | ICD-10-CM | POA: Diagnosis not present

## 2020-01-31 DIAGNOSIS — M159 Polyosteoarthritis, unspecified: Secondary | ICD-10-CM | POA: Diagnosis not present

## 2020-01-31 DIAGNOSIS — I10 Essential (primary) hypertension: Secondary | ICD-10-CM | POA: Diagnosis not present

## 2020-01-31 DIAGNOSIS — M069 Rheumatoid arthritis, unspecified: Secondary | ICD-10-CM | POA: Diagnosis not present

## 2020-02-05 DIAGNOSIS — Z6824 Body mass index (BMI) 24.0-24.9, adult: Secondary | ICD-10-CM | POA: Diagnosis not present

## 2020-02-05 DIAGNOSIS — Z299 Encounter for prophylactic measures, unspecified: Secondary | ICD-10-CM | POA: Diagnosis not present

## 2020-02-05 DIAGNOSIS — I10 Essential (primary) hypertension: Secondary | ICD-10-CM | POA: Diagnosis not present

## 2020-02-05 DIAGNOSIS — Z789 Other specified health status: Secondary | ICD-10-CM | POA: Diagnosis not present

## 2020-02-05 DIAGNOSIS — R569 Unspecified convulsions: Secondary | ICD-10-CM | POA: Diagnosis not present

## 2020-02-07 ENCOUNTER — Ambulatory Visit
Admission: EM | Admit: 2020-02-07 | Discharge: 2020-02-07 | Disposition: A | Discharge status: Home / Self Care | Payer: Medicare Other | Attending: Emergency Medicine | Admitting: Emergency Medicine

## 2020-02-07 ENCOUNTER — Other Ambulatory Visit: Payer: Self-pay

## 2020-02-07 ENCOUNTER — Encounter: Payer: Self-pay | Admitting: Emergency Medicine

## 2020-02-07 DIAGNOSIS — Z1152 Encounter for screening for COVID-19: Secondary | ICD-10-CM | POA: Diagnosis not present

## 2020-02-07 DIAGNOSIS — Z299 Encounter for prophylactic measures, unspecified: Secondary | ICD-10-CM | POA: Diagnosis not present

## 2020-02-07 DIAGNOSIS — R0602 Shortness of breath: Secondary | ICD-10-CM | POA: Diagnosis not present

## 2020-02-07 DIAGNOSIS — R059 Cough, unspecified: Secondary | ICD-10-CM | POA: Diagnosis not present

## 2020-02-07 DIAGNOSIS — H6593 Unspecified nonsuppurative otitis media, bilateral: Secondary | ICD-10-CM | POA: Diagnosis not present

## 2020-02-07 DIAGNOSIS — Z789 Other specified health status: Secondary | ICD-10-CM | POA: Diagnosis not present

## 2020-02-07 DIAGNOSIS — I10 Essential (primary) hypertension: Secondary | ICD-10-CM | POA: Diagnosis not present

## 2020-02-07 DIAGNOSIS — J069 Acute upper respiratory infection, unspecified: Secondary | ICD-10-CM | POA: Diagnosis not present

## 2020-02-07 HISTORY — DX: Essential (primary) hypertension: I10

## 2020-02-07 MED ORDER — ALBUTEROL SULFATE HFA 108 (90 BASE) MCG/ACT IN AERS: 1.0000 | INHALATION_SPRAY | Freq: Four times a day (QID) | RESPIRATORY_TRACT | 0 refills | Status: DC | PRN

## 2020-02-07 MED ORDER — PREDNISONE 10 MG PO TABS: 20.0000 mg | ORAL_TABLET | Freq: Every day | ORAL | 0 refills | Status: AC

## 2020-02-07 MED ORDER — BENZONATATE 100 MG PO CAPS: 100.0000 mg | ORAL_CAPSULE | Freq: Three times a day (TID) | ORAL | 0 refills | Status: DC | PRN

## 2020-02-07 NOTE — ED Triage Notes (Signed)
Ear pain , throat pain, cough, sneezing, body aches that started Tuesday night

## 2020-02-07 NOTE — Discharge Instructions (Signed)
COVID for 19, flu A/B testing ordered.  It will take between 2-7 days for test results.  Someone will contact you regarding abnormal results.    Get plenty of rest and push fluids Tessalon Perles prescribed for cough ProAir was prescribed Prednisone was prescribed Use medications daily for symptom relief Use OTC medications like ibuprofen or tylenol as needed fever or pain Call or go to the ED if you have any new or worsening symptoms such as fever, worsening cough, shortness of breath, chest tightness, chest pain, turning blue, changes in mental status, etc..

## 2020-02-07 NOTE — ED Provider Notes (Signed)
[facility]   [id number] 02/07/20 Arrival Time: 1357   CC: COVID symptoms  SUBJECTIVE: History from: patient.  [name] [name] is a 65 y.o. female who presents to urgent care with a complaint of cough, nasal congestion, ear pain, sore throat and body aches that started 2 days ago.  Denies sick exposure to COVID, flu or strep.  Denies recent travel.  Has tried OTC medication without relief.  Denies alleviating or aggravating factors.  Denies previous symptoms in the past.   Denies fever, chills, fatigue, sinus pain, rhinorrhea, sore throat, SOB, wheezing, chest pain, nausea, changes in bowel or bladder habits.    ROS: As per HPI.  All other pertinent ROS negative.      Past Medical History:  Diagnosis Date  . Allergy   . Anxiety   . Complication of anesthesia   . Depression   . Diverticulosis   . Dysphagia   . Epilepsy (HCC)   . Family history of adverse reaction to anesthesia    sisters with ponv  . Fecal incontinence    occ  . Fibromyalgia   . Fractures   . GERD (gastroesophageal reflux disease)    Last EGD with 58F dilation by Dr. Jena Gauss 07/29/2006  . Hemorrhoids 10/2004   Otherwise normal colonoscopy by Dr. Jena Gauss  . Hepatitis C antibody test positive    confirmed positive ab during 07/2011 hospitalization  . Hiatal hernia   . Hypertension   . IBS (irritable bowel syndrome)   . Osteoarthritis   . Osteopenia   . Osteoporosis   . Peptic stricture of esophagus    2006  . Polysubstance abuse (HCC)    positive UDS for cocaine, etoh abuse  . PONV (postoperative nausea and vomiting)   . Rheumatoid arthritis(714.0)   . Schatzki's ring    multiple dilations  . Seasonal allergies   . Seizures (HCC)    last seizure 2009  . Sinus infection    finished antibiotic 02-16-18   Past Surgical History:  Procedure Laterality Date  . APPENDECTOMY    . BACK SURGERY     lower  . COLONOSCOPY  10/08/10   URK:[id number] diverticulosis otherwise normal colon and rectum  .  ESOPHAGEAL MANOMETRY N/A 11/30/2017   Procedure: ESOPHAGEAL MANOMETRY (EM);  Surgeon: Napoleon Form, MD;  Location: [facility];  Service: Endoscopy;  Laterality: N/A;  . ESOPHAGOGASTRODUODENOSCOPY  10/08/10   [id number] hiatal hernia otherwise normal  . EYE SURGERY     right cyst removed  . LAPAROSCOPIC HYSTERECTOMY    . left thumb surgery    . NOSE SURGERY    . RECTOCELE REPAIR    . S/P Hysterectomy     complete  . Thumb surgery Left   . TUBAL LIGATION     Allergies  Allergen Reactions  . Morphine And Related Other (See Comments)    Hallucinations  . Penicillins Other (See Comments)    Turned purple with Penicillin shot when younger Did it involve swelling of the face/tongue/throat, SOB, or low BP? no Did it involve sudden or severe rash/hives, skin peeling, or any reaction on the inside of your mouth or nose? no Did you need to seek medical attention at a hospital or doctor's office? yes When did it last happen?1956 If all above answers are "NO", may proceed with cephalosporin use.   . Sulfonamide Derivatives Other (See Comments)    REACTION: UNKNOWN REACTION   No current facility-administered medications on file prior to encounter.   Current Outpatient  Medications on File Prior to Encounter  Medication Sig Dispense Refill  . ALPRAZolam (XANAX) 0.5 MG tablet Take 0.5 mg by mouth 3 (three) times daily as needed for anxiety.    . Camphor-Eucalyptus-Menthol (VAPORX BALM EX) Apply 1 application topically as needed (dry skin).    Marland Kitchen dexlansoprazole (DEXILANT) 60 MG capsule Take 1 capsule (60 mg total) by mouth daily. 31 capsule 3  . famotidine (PEPCID) 40 MG tablet Take 40 mg by mouth daily.    Marland Kitchen ibuprofen (ADVIL,MOTRIN) 200 MG tablet Take 400 mg by mouth every 6 (six) hours as needed for headache or moderate pain.     Marland Kitchen ondansetron (ZOFRAN) 4 MG tablet Take 1 tablet (4 mg total) by mouth every 8 (eight) hours as needed for nausea. 8 tablet 5  . oxybutynin (DITROPAN-XL)  10 MG 24 hr tablet Take 10 mg by mouth at bedtime.     . primidone (MYSOLINE) 250 MG tablet Take 250 mg by mouth 3 (three) times daily.     . promethazine (PHENERGAN) 25 MG suppository Place 1 suppository (25 mg total) rectally every 6 (six) hours as needed for nausea. 5 suppository 5  . traMADol (ULTRAM) 50 MG tablet Take 1-2 tablets (50-100 mg total) by mouth every 6 (six) hours as needed for moderate pain or severe pain. 20 tablet 0  . vitamin C (ASCORBIC ACID) 500 MG tablet Take 500 mg by mouth daily.      Social History   Socioeconomic History  . Marital status: Divorced    Spouse name: Not on file  . Number of children: 1  . Years of education: Not on file  . Highest education level: Not on file  Occupational History  . Occupation: disability    Employer: NOT EMPLOYED  Tobacco Use  . Smoking status: Never Smoker  . Smokeless tobacco: Never Used  . Tobacco comment: quit as a teenager  Vaping Use  . Vaping Use: Never used  Substance and Sexual Activity  . Alcohol use: Yes    Comment: 6 beers per week  . Drug use: No    Comment: h/o cocaine in past; none since june 2018  . Sexual activity: Not on file  Other Topics Concern  . Not on file  Social History Narrative  . Not on file   Social Determinants of Health   Financial Resource Strain: Not on file  Food Insecurity: Not on file  Transportation Needs: Not on file  Physical Activity: Not on file  Stress: Not on file  Social Connections: Not on file  Intimate Partner Violence: Not on file   Family History  Problem Relation Age of Onset  . Leukemia Father   . Alcohol abuse Father   . Arthritis Father   . Cancer Father   . Alzheimer's disease Mother   . Arthritis Mother   . Depression Mother   . Arthritis Sister   . Diabetes Sister   . Cancer Sister   . Leukemia Sister   . Anesthesia problems Neg Hx   . Hypotension Neg Hx   . Malignant hyperthermia Neg Hx   . Pseudochol deficiency Neg Hx   . Colon cancer  Neg Hx   . Esophageal cancer Neg Hx   . Stomach cancer Neg Hx   . Rectal cancer Neg Hx     OBJECTIVE:  Vitals:   02/07/20 1518  BP: 118/77  Pulse: 97  Resp: 18  Temp: 98 F (36.7 C)  TempSrc: Oral  SpO2: 94%  General appearance: alert; appears fatigued, but nontoxic; speaking in full sentences and tolerating own secretions HEENT: NCAT; Ears: Bilateral TM with middle ear effusion; Eyes: PERRL.  EOM grossly intact. Sinuses: nontender; Nose: nares patent without rhinorrhea, Throat: oropharynx clear, tonsils non erythematous or enlarged, uvula midline  Neck: supple without LAD Lungs: unlabored respirations, symmetrical air entry; cough: moderate; no respiratory distress; CTAB Heart: regular rate and rhythm.  Radial pulses 2+ symmetrical bilaterally Skin: warm and dry Psychological: alert and cooperative; normal mood and affect  LABS:  No results found for this or any previous visit (from the past 24 hour(s)).   ASSESSMENT & PLAN:  1. URI with cough and congestion   2. Encounter for screening for COVID-19   3. Middle ear effusion, bilateral     Meds ordered this encounter  Medications  . benzonatate (TESSALON) 100 MG capsule    Sig: Take 1 capsule (100 mg total) by mouth 3 (three) times daily as needed for cough.    Dispense:  30 capsule    Refill:  0  . albuterol (VENTOLIN HFA) 108 (90 Base) MCG/ACT inhaler    Sig: Inhale 1-2 puffs into the lungs every 6 (six) hours as needed for wheezing or shortness of breath.    Dispense:  18 g    Refill:  0  . predniSONE (DELTASONE) 10 MG tablet    Sig: Take 2 tablets (20 mg total) by mouth daily for 7 days.    Dispense:  14 tablet    Refill:  0    Discharge Instructions    COVID for 19, flu A/B testing ordered.  It will take between 2-7 days for test results.  Someone will contact you regarding abnormal results.    Get plenty of rest and push fluids Tessalon Perles prescribed for cough ProAir was prescribed Prednisone  was prescribed Use medications daily for symptom relief Use OTC medications like ibuprofen or tylenol as needed fever or pain Call or go to the ED if you have any new or worsening symptoms such as fever, worsening cough, shortness of breath, chest tightness, chest pain, turning blue, changes in mental status, etc...   Reviewed expectations re: course of current medical issues. Questions answered. Outlined signs and symptoms indicating need for more acute intervention. Patient verbalized understanding. After Visit Summary given.         Emerson Monte, [place] 02/07/20 [phone]

## 2020-02-12 LAB — COVID-19, FLU A+B NAA
Influenza A, NAA: NOT DETECTED
Influenza B, NAA: NOT DETECTED
SARS-CoV-2, NAA: DETECTED — AB

## 2020-03-06 ENCOUNTER — Ambulatory Visit: Payer: Medicare Other | Attending: Internal Medicine

## 2020-03-06 DIAGNOSIS — Z23 Encounter for immunization: Secondary | ICD-10-CM

## 2020-03-06 NOTE — Progress Notes (Signed)
   Covid-19 Vaccination Clinic  Name:  [name] [name]    MRN: [id number] DOB: 1956/01/30  03/06/2020  [name] was observed post Covid-19 immunization for 15 minutes without incident. She was provided with Vaccine Information Sheet and instruction to access the V-Safe system.   [name] was instructed to call 911 with any severe reactions post vaccine: Marland Kitchen Difficulty breathing  . Swelling of face and throat  . A fast heartbeat  . A bad rash all over body  . Dizziness and weakness   Immunizations Administered    Name Date Dose VIS Date Route   PFIZER Comrnaty(Gray TOP) Covid-19 Vaccine 03/06/2020  1:09 PM 0.3 mL 01/10/2020 Intramuscular   Manufacturer: [employer]   Lot: [id number]   NDC: [phone]

## 2020-04-09 DIAGNOSIS — Z299 Encounter for prophylactic measures, unspecified: Secondary | ICD-10-CM | POA: Diagnosis not present

## 2020-04-09 DIAGNOSIS — R569 Unspecified convulsions: Secondary | ICD-10-CM | POA: Diagnosis not present

## 2020-04-09 DIAGNOSIS — J309 Allergic rhinitis, unspecified: Secondary | ICD-10-CM | POA: Diagnosis not present

## 2020-05-20 DIAGNOSIS — I10 Essential (primary) hypertension: Secondary | ICD-10-CM | POA: Diagnosis not present

## 2020-05-20 DIAGNOSIS — Z1389 Encounter for screening for other disorder: Secondary | ICD-10-CM | POA: Diagnosis not present

## 2020-05-20 DIAGNOSIS — Z0000 Encounter for general adult medical examination without abnormal findings: Secondary | ICD-10-CM | POA: Diagnosis not present

## 2020-05-20 DIAGNOSIS — J309 Allergic rhinitis, unspecified: Secondary | ICD-10-CM | POA: Diagnosis not present

## 2020-05-20 DIAGNOSIS — Z6823 Body mass index (BMI) 23.0-23.9, adult: Secondary | ICD-10-CM | POA: Diagnosis not present

## 2020-05-20 DIAGNOSIS — R569 Unspecified convulsions: Secondary | ICD-10-CM | POA: Diagnosis not present

## 2020-05-20 DIAGNOSIS — Z7189 Other specified counseling: Secondary | ICD-10-CM | POA: Diagnosis not present

## 2020-05-20 DIAGNOSIS — Z299 Encounter for prophylactic measures, unspecified: Secondary | ICD-10-CM | POA: Diagnosis not present

## 2020-05-26 DIAGNOSIS — M25531 Pain in right wrist: Secondary | ICD-10-CM | POA: Diagnosis not present

## 2020-05-26 DIAGNOSIS — Z299 Encounter for prophylactic measures, unspecified: Secondary | ICD-10-CM | POA: Diagnosis not present

## 2020-05-26 DIAGNOSIS — R296 Repeated falls: Secondary | ICD-10-CM | POA: Diagnosis not present

## 2020-05-30 DIAGNOSIS — S62101D Fracture of unspecified carpal bone, right wrist, subsequent encounter for fracture with routine healing: Secondary | ICD-10-CM | POA: Diagnosis not present

## 2020-06-17 ENCOUNTER — Ambulatory Visit: Payer: Medicare Other

## 2020-06-17 ENCOUNTER — Encounter: Payer: Self-pay | Admitting: Orthopaedic Surgery

## 2020-06-17 ENCOUNTER — Other Ambulatory Visit: Payer: Self-pay

## 2020-06-17 ENCOUNTER — Ambulatory Visit (INDEPENDENT_AMBULATORY_CARE_PROVIDER_SITE_OTHER): Payer: Medicare Other | Admitting: Orthopaedic Surgery

## 2020-06-17 VITALS — BP 92/53 | HR 77 | Ht 62.0 in | Wt 150.0 lb

## 2020-06-17 DIAGNOSIS — M79671 Pain in right foot: Secondary | ICD-10-CM

## 2020-06-17 DIAGNOSIS — M79641 Pain in right hand: Secondary | ICD-10-CM

## 2020-06-17 DIAGNOSIS — M25531 Pain in right wrist: Secondary | ICD-10-CM | POA: Diagnosis not present

## 2020-06-17 DIAGNOSIS — M25571 Pain in right ankle and joints of right foot: Secondary | ICD-10-CM

## 2020-06-17 DIAGNOSIS — W19XXXA Unspecified fall, initial encounter: Secondary | ICD-10-CM | POA: Diagnosis not present

## 2020-06-17 DIAGNOSIS — S82844A Nondisplaced bimalleolar fracture of right lower leg, initial encounter for closed fracture: Secondary | ICD-10-CM

## 2020-06-17 MED ORDER — HYDROCODONE-ACETAMINOPHEN 5-325 MG PO TABS: ORAL_TABLET | ORAL | 0 refills | Status: DC

## 2020-06-17 NOTE — Progress Notes (Signed)
Subjective:    Patient ID: [name] [name], female    DOB: 23-Dec-1955, 65 y.o.   MRN: [id number]  HPI She fell this morning shortly before coming to office.  She hurt her right hand and wrist and right ankle. She has swelling and pain of the right ankle and cannot stand on it.  She has no other injury. She is alert and oriented.  She has bruising of the right dorsal hand.     Review of Systems  Constitutional: Positive for activity change.  Musculoskeletal: Positive for arthralgias, gait problem and joint swelling.  Allergic/Immunologic: Positive for environmental allergies.  Psychiatric/Behavioral: The patient is nervous/anxious.   All other systems reviewed and are negative.  For Review of Systems, all other systems reviewed and are negative.  The following is a summary of the past history medically, past history surgically, known current medicines, social history and family history.  This information is gathered electronically by the computer from prior information and documentation.  I review this each visit and have found including this information at this point in the chart is beneficial and informative.   Past Medical History:  Diagnosis Date  . Allergy   . Anxiety   . Complication of anesthesia   . Depression   . Diverticulosis   . Dysphagia   . Epilepsy ([place])   . Family history of adverse reaction to anesthesia    sisters with ponv  . Fecal incontinence    occ  . Fibromyalgia   . Fractures   . GERD (gastroesophageal reflux disease)    Last EGD with 25F dilation by Dr. Gala Romney 07/29/2006  . Hemorrhoids 10/2004   Otherwise normal colonoscopy by Dr. Gala Romney  . Hepatitis C antibody test positive    confirmed positive ab during 07/2011 hospitalization  . Hiatal hernia   . Hypertension   . IBS (irritable bowel syndrome)   . Osteoarthritis   . Osteopenia   . Osteoporosis   . Peptic stricture of esophagus    2006  . Polysubstance abuse ([place])    positive UDS for cocaine,  etoh abuse  . PONV (postoperative nausea and vomiting)   . Rheumatoid arthritis(714.0)   . Schatzki's ring    multiple dilations  . Seasonal allergies   . Seizures ([place])    last seizure 2009  . Sinus infection    finished antibiotic 02-16-18    Past Surgical History:  Procedure Laterality Date  . APPENDECTOMY    . BACK SURGERY     lower  . COLONOSCOPY  10/08/10   [id number] diverticulosis otherwise normal colon and rectum  . ESOPHAGEAL MANOMETRY N/A 11/30/2017   Procedure: ESOPHAGEAL MANOMETRY (EM);  Surgeon: Mauri Pole, MD;  Location: [facility];  Service: Endoscopy;  Laterality: N/A;  . ESOPHAGOGASTRODUODENOSCOPY  10/08/10   [id number] hiatal hernia otherwise normal  . EYE SURGERY     right cyst removed  . LAPAROSCOPIC HYSTERECTOMY    . left thumb surgery    . NOSE SURGERY    . RECTOCELE REPAIR    . S/P Hysterectomy     complete  . Thumb surgery Left   . TUBAL LIGATION      Current Outpatient Medications on File Prior to Visit  Medication Sig Dispense Refill  . albuterol (VENTOLIN HFA) 108 (90 Base) MCG/ACT inhaler Inhale 1-2 puffs into the lungs every 6 (six) hours as needed for wheezing or shortness of breath. 18 g 0  . ALPRAZolam (XANAX) 0.5 MG tablet Take 0.5 mg  by mouth 3 (three) times daily as needed for anxiety.    . Camphor-Eucalyptus-Menthol (VAPORX BALM EX) Apply 1 application topically as needed (dry skin).    Marland Kitchen dexlansoprazole (DEXILANT) 60 MG capsule Take 1 capsule (60 mg total) by mouth daily. 31 capsule 3  . famotidine (PEPCID) 40 MG tablet Take 40 mg by mouth daily.    Marland Kitchen FLUoxetine (PROZAC) 20 MG capsule Take 20 mg by mouth daily.    Marland Kitchen lisinopril (ZESTRIL) 40 MG tablet Take 40 mg by mouth daily.    . metoprolol tartrate (LOPRESSOR) 25 MG tablet Take 25 mg by mouth daily.    . ondansetron (ZOFRAN) 4 MG tablet Take 1 tablet (4 mg total) by mouth every 8 (eight) hours as needed for nausea. 8 tablet 5  . primidone (MYSOLINE) 250 MG tablet Take 250  mg by mouth 3 (three) times daily.    . promethazine (PHENERGAN) 25 MG suppository Place 1 suppository (25 mg total) rectally every 6 (six) hours as needed for nausea. 5 suppository 5  . traMADol (ULTRAM) 50 MG tablet Take 1-2 tablets (50-100 mg total) by mouth every 6 (six) hours as needed for moderate pain or severe pain. 20 tablet 0  . triamcinolone cream (KENALOG) 0.1 % Apply 1 application topically 2 (two) times daily.    . vitamin C (ASCORBIC ACID) 500 MG tablet Take 500 mg by mouth daily.     No current facility-administered medications on file prior to visit.    Social History   Socioeconomic History  . Marital status: Divorced    Spouse name: Not on file  . Number of children: 1  . Years of education: Not on file  . Highest education level: Not on file  Occupational History  . Occupation: disability    Employer: NOT EMPLOYED  Tobacco Use  . Smoking status: Never Smoker  . Smokeless tobacco: Never Used  . Tobacco comment: quit as a teenager  Vaping Use  . Vaping Use: Never used  Substance and Sexual Activity  . Alcohol use: Yes    Comment: 6 beers per week  . Drug use: No    Comment: h/o cocaine in past; none since june 2018  . Sexual activity: Not on file  Other Topics Concern  . Not on file  Social History Narrative  . Not on file   Social Determinants of Health   Financial Resource Strain: Not on file  Food Insecurity: Not on file  Transportation Needs: Not on file  Physical Activity: Not on file  Stress: Not on file  Social Connections: Not on file  Intimate Partner Violence: Not on file    Family History  Problem Relation Age of Onset  . Leukemia Father   . Alcohol abuse Father   . Arthritis Father   . Cancer Father   . Alzheimer's disease Mother   . Arthritis Mother   . Depression Mother   . Arthritis Sister   . Diabetes Sister   . Cancer Sister   . Leukemia Sister   . Anesthesia problems Neg Hx   . Hypotension Neg Hx   . Malignant  hyperthermia Neg Hx   . Pseudochol deficiency Neg Hx   . Colon cancer Neg Hx   . Esophageal cancer Neg Hx   . Stomach cancer Neg Hx   . Rectal cancer Neg Hx     BP (!) 92/53   Pulse 77   Ht 5\' 2"  (1.575 m)   Wt 150 lb (68 kg)  Comment: per patient  BMI 27.44 kg/m   Body mass index is 27.44 kg/m.      Objective:   Physical Exam Vitals and nursing note reviewed. Exam conducted with a chaperone present.  Constitutional:      Appearance: She is well-developed.  HENT:     Head: Normocephalic and atraumatic.  Eyes:     Conjunctiva/sclera: Conjunctivae normal.     Pupils: Pupils are equal, round, and reactive to light.  Cardiovascular:     Rate and Rhythm: Normal rate and regular rhythm.  Pulmonary:     Effort: Pulmonary effort is normal.  Abdominal:     Palpations: Abdomen is soft.  Musculoskeletal:       Hands:     Cervical back: Normal range of motion and neck supple.       Feet:  Skin:    General: Skin is warm and dry.  Neurological:     Mental Status: She is alert and oriented to person, place, and time.     Cranial Nerves: No cranial nerve deficit.     Motor: No abnormal muscle tone.     Coordination: Coordination normal.     Deep Tendon Reflexes: Reflexes are normal and symmetric. Reflexes normal.  Psychiatric:        Behavior: Behavior normal.        Thought Content: Thought content normal.        Judgment: Judgment normal.    X-rays were done of the right ankle, right wrist and right foot, reported separately.      Assessment & Plan:   Encounter Diagnoses  Name Primary?  . Pain in right ankle and joints of right foot Yes  . Pain in right hand   . Pain in right wrist   . Pain in right foot   . Nondisplaced bimalleolar fracture of right ankle, closed, initial encounter    I have explained about her fractures of the ankle.  I will give CAM walker.  Elevate, use ice, I will Rx pain medicine.  She has a walker at home and use it.  A cock-up splint  given.  I have reviewed the [place] web site prior to prescribing narcotic medicine for this patient.    Return in one week.  Call if any problem.  Precautions discussed.   Electronically Signed Sanjuana Kava, MD 5/17/202211:16 AM

## 2020-06-24 ENCOUNTER — Ambulatory Visit: Payer: Medicare Other

## 2020-06-24 ENCOUNTER — Other Ambulatory Visit: Payer: Self-pay

## 2020-06-24 ENCOUNTER — Encounter: Payer: Self-pay | Admitting: Orthopaedic Surgery

## 2020-06-24 ENCOUNTER — Telehealth: Payer: Self-pay | Admitting: Orthopaedic Surgery

## 2020-06-24 ENCOUNTER — Ambulatory Visit (INDEPENDENT_AMBULATORY_CARE_PROVIDER_SITE_OTHER): Payer: Medicare Other | Admitting: Orthopaedic Surgery

## 2020-06-24 DIAGNOSIS — S82844D Nondisplaced bimalleolar fracture of right lower leg, subsequent encounter for closed fracture with routine healing: Secondary | ICD-10-CM | POA: Diagnosis not present

## 2020-06-24 DIAGNOSIS — M25531 Pain in right wrist: Secondary | ICD-10-CM | POA: Diagnosis not present

## 2020-06-24 DIAGNOSIS — Z79899 Other long term (current) drug therapy: Secondary | ICD-10-CM | POA: Diagnosis not present

## 2020-06-24 DIAGNOSIS — R5383 Other fatigue: Secondary | ICD-10-CM | POA: Diagnosis not present

## 2020-06-24 DIAGNOSIS — E7800 Pure hypercholesterolemia, unspecified: Secondary | ICD-10-CM | POA: Diagnosis not present

## 2020-06-24 NOTE — Progress Notes (Signed)
I have less pain.  She has been using the CAM walker and the splint on the right arm.  She has no new trauma.  She is doing well.  She has resolving ecchymosis of the right ankle and foot.  NV intact. ROM of ankle is tender.    X-rays were done of the right ankle, reported separately.  Encounter Diagnoses  Name Primary?  . Nondisplaced bimalleolar fracture of right ankle, closed, with routine healing, subsequent encounter Yes  . Pain in right wrist    Continue the CAM walker.  A cock-up splint given.  Return in two weeks.  X-rays right ankle on return.  Call if any problem.  Precautions discussed.   Electronically Signed Sanjuana Kava, MD 5/24/202211:15 AM

## 2020-07-01 ENCOUNTER — Ambulatory Visit: Payer: Medicare Other | Admitting: Orthopaedic Surgery

## 2020-07-08 ENCOUNTER — Other Ambulatory Visit: Payer: Self-pay

## 2020-07-08 ENCOUNTER — Ambulatory Visit: Payer: Medicare Other

## 2020-07-08 ENCOUNTER — Ambulatory Visit (INDEPENDENT_AMBULATORY_CARE_PROVIDER_SITE_OTHER): Payer: Medicare Other | Admitting: Orthopaedic Surgery

## 2020-07-08 ENCOUNTER — Encounter: Payer: Self-pay | Admitting: Orthopaedic Surgery

## 2020-07-08 DIAGNOSIS — S82844D Nondisplaced bimalleolar fracture of right lower leg, subsequent encounter for closed fracture with routine healing: Secondary | ICD-10-CM

## 2020-07-08 NOTE — Progress Notes (Signed)
I am better.  She has been using CAM walker on the right foot and cock-up splint on the right wrist.  She has no pain, no new trauma  ROM of wrist and ankle is good.  NV intact.  X-rays were done of the right ankle, reported separately.  Encounter Diagnosis  Name Primary?  . Nondisplaced bimalleolar fracture of right ankle, closed, with routine healing, subsequent encounter Yes   Return in three weeks.  She may come out of CAM walker and cock-up splint in the house.  X-rays on return.  Call if any problem.  Precautions discussed.   Electronically Signed Sanjuana Kava, MD 6/7/202210:33 AM

## 2020-07-29 ENCOUNTER — Other Ambulatory Visit: Payer: Self-pay

## 2020-07-29 ENCOUNTER — Encounter: Payer: Self-pay | Admitting: Orthopaedic Surgery

## 2020-07-29 ENCOUNTER — Ambulatory Visit: Payer: Medicare Other

## 2020-07-29 ENCOUNTER — Ambulatory Visit (INDEPENDENT_AMBULATORY_CARE_PROVIDER_SITE_OTHER): Payer: Medicare Other | Admitting: Orthopaedic Surgery

## 2020-07-29 DIAGNOSIS — S82844D Nondisplaced bimalleolar fracture of right lower leg, subsequent encounter for closed fracture with routine healing: Secondary | ICD-10-CM

## 2020-07-29 NOTE — Patient Instructions (Signed)
Start to come out of the boot, at home, but still wear in public

## 2020-07-29 NOTE — Progress Notes (Signed)
I am much better.  Her right ankle is slightly tender but she is doing well.  She has been in the CAM walker.  NV intact.  X-rays were done of the right ankle, three views.  Encounter Diagnosis  Name Primary?   Nondisplaced bimalleolar fracture of right ankle, closed, with routine healing, subsequent encounter Yes   She can come out of CAM walker in house, wear it outside.  Return in two weeks.  X-rays of right ankle then.  Call if any problem.  Precautions discussed.  Electronically Signed Sanjuana Kava, MD 6/28/20221:47 PM

## 2020-07-31 DIAGNOSIS — I10 Essential (primary) hypertension: Secondary | ICD-10-CM | POA: Diagnosis not present

## 2020-07-31 DIAGNOSIS — Z299 Encounter for prophylactic measures, unspecified: Secondary | ICD-10-CM | POA: Diagnosis not present

## 2020-08-12 ENCOUNTER — Encounter: Payer: Medicare Other | Admitting: Orthopaedic Surgery

## 2020-08-28 DIAGNOSIS — K5792 Diverticulitis of intestine, part unspecified, without perforation or abscess without bleeding: Secondary | ICD-10-CM | POA: Diagnosis not present

## 2020-08-28 DIAGNOSIS — K625 Hemorrhage of anus and rectum: Secondary | ICD-10-CM | POA: Diagnosis not present

## 2020-08-28 DIAGNOSIS — Z299 Encounter for prophylactic measures, unspecified: Secondary | ICD-10-CM | POA: Diagnosis not present

## 2020-09-12 DIAGNOSIS — I10 Essential (primary) hypertension: Secondary | ICD-10-CM | POA: Diagnosis not present

## 2020-09-12 DIAGNOSIS — Z299 Encounter for prophylactic measures, unspecified: Secondary | ICD-10-CM | POA: Diagnosis not present

## 2020-09-12 DIAGNOSIS — R109 Unspecified abdominal pain: Secondary | ICD-10-CM | POA: Diagnosis not present

## 2020-10-28 DIAGNOSIS — S2231XA Fracture of one rib, right side, initial encounter for closed fracture: Secondary | ICD-10-CM | POA: Diagnosis not present

## 2020-10-31 ENCOUNTER — Telehealth: Payer: Self-pay | Admitting: Gastroenterology

## 2020-10-31 NOTE — Telephone Encounter (Signed)
Spoke with patient, she states that she has lost so much weight. Pt states that there is something wrong with the "flaps" in her esophagus, she states that she had surgery about 2 years ago. Pt states that she will eat about 2-3 bites of food and will have some regurgitation. Pt states that she drinks fine but can't eat many solid foods. Pt states that her family is concerned because she is under 120 lbs now. Pt states that she is eating baby food, and will eat ramen noodles with an egg to get her protein. If she cant get the noodles down she will drink the broth and try to eat the egg. Advised patient that she will need to supplement with Boost, Ensure, or protein shakes/smoothies. Pt has not been seen in several years. She has been scheduled for an appt with Dr. Loletha Carrow on Monday, 11/03/20 at 3:40 PM. Advised patient that she will need to go to the ER if she is not able to eat or drink. Pt verbalized understanding of all information and had no concerns at the end of the call.

## 2020-10-31 NOTE — Telephone Encounter (Signed)
Patient called requesting to speak with a nurse is having issues with eating.

## 2020-11-03 ENCOUNTER — Ambulatory Visit: Payer: Medicare Other | Admitting: Gastroenterology

## 2020-11-04 DIAGNOSIS — Z299 Encounter for prophylactic measures, unspecified: Secondary | ICD-10-CM | POA: Diagnosis not present

## 2020-11-04 DIAGNOSIS — I10 Essential (primary) hypertension: Secondary | ICD-10-CM | POA: Diagnosis not present

## 2020-11-04 DIAGNOSIS — E440 Moderate protein-calorie malnutrition: Secondary | ICD-10-CM | POA: Diagnosis not present

## 2020-11-04 DIAGNOSIS — R21 Rash and other nonspecific skin eruption: Secondary | ICD-10-CM | POA: Diagnosis not present

## 2020-11-04 DIAGNOSIS — Z23 Encounter for immunization: Secondary | ICD-10-CM | POA: Diagnosis not present

## 2021-01-15 DIAGNOSIS — Z299 Encounter for prophylactic measures, unspecified: Secondary | ICD-10-CM | POA: Diagnosis not present

## 2021-01-15 DIAGNOSIS — R1310 Dysphagia, unspecified: Secondary | ICD-10-CM | POA: Diagnosis not present

## 2021-01-15 DIAGNOSIS — Z6820 Body mass index (BMI) 20.0-20.9, adult: Secondary | ICD-10-CM | POA: Diagnosis not present

## 2021-01-15 DIAGNOSIS — J069 Acute upper respiratory infection, unspecified: Secondary | ICD-10-CM | POA: Diagnosis not present

## 2021-01-15 DIAGNOSIS — R519 Headache, unspecified: Secondary | ICD-10-CM | POA: Diagnosis not present

## 2021-02-04 DIAGNOSIS — Z6820 Body mass index (BMI) 20.0-20.9, adult: Secondary | ICD-10-CM | POA: Diagnosis not present

## 2021-02-04 DIAGNOSIS — I10 Essential (primary) hypertension: Secondary | ICD-10-CM | POA: Diagnosis not present

## 2021-02-04 DIAGNOSIS — Z299 Encounter for prophylactic measures, unspecified: Secondary | ICD-10-CM | POA: Diagnosis not present

## 2021-02-04 DIAGNOSIS — R040 Epistaxis: Secondary | ICD-10-CM | POA: Diagnosis not present

## 2021-02-19 ENCOUNTER — Encounter: Payer: Self-pay | Admitting: Gastroenterology

## 2021-02-19 ENCOUNTER — Ambulatory Visit (INDEPENDENT_AMBULATORY_CARE_PROVIDER_SITE_OTHER): Payer: Medicare Other | Admitting: Gastroenterology

## 2021-02-19 VITALS — BP 110/64 | HR 75 | Ht 62.0 in | Wt 110.0 lb

## 2021-02-19 DIAGNOSIS — R634 Abnormal weight loss: Secondary | ICD-10-CM

## 2021-02-19 DIAGNOSIS — R1319 Other dysphagia: Secondary | ICD-10-CM | POA: Diagnosis not present

## 2021-02-19 DIAGNOSIS — R198 Other specified symptoms and signs involving the digestive system and abdomen: Secondary | ICD-10-CM | POA: Diagnosis not present

## 2021-02-19 DIAGNOSIS — K529 Noninfective gastroenteritis and colitis, unspecified: Secondary | ICD-10-CM | POA: Diagnosis not present

## 2021-02-19 MED ORDER — DICYCLOMINE HCL 10 MG PO CAPS: 10.0000 mg | ORAL_CAPSULE | Freq: Three times a day (TID) | ORAL | 0 refills | Status: DC

## 2021-02-19 MED ORDER — METOCLOPRAMIDE HCL 5 MG PO TABS
5.0000 mg | ORAL_TABLET | Freq: Two times a day (BID) | ORAL | 0 refills | Status: AC | PRN
Start: 2021-02-19 — End: ?

## 2021-02-19 MED ORDER — NA SULFATE-K SULFATE-MG SULF 17.5-3.13-1.6 GM/177ML PO SOLN: 1.0000 | Freq: Once | ORAL | 0 refills | Status: AC

## 2021-02-19 NOTE — Patient Instructions (Signed)
If you are age 68 or older, your body mass index should be between 23-30. Your Body mass index is 20.12 kg/m. If this is out of the aforementioned range listed, please consider follow up with your Primary Care Provider.  If you are age 71 or younger, your body mass index should be between 19-25. Your Body mass index is 20.12 kg/m. If this is out of the aformentioned range listed, please consider follow up with your Primary Care Provider.   ________________________________________________________  The [facility] providers would like to encourage you to use [facility] to communicate with providers for non-urgent requests or questions.  Due to long hold times on the telephone, sending your provider a message by [facility] may be a faster and more efficient way to get a response.  Please allow 48 business hours for a response.  Please remember that this is for non-urgent requests.  _______________________________________________________  [name] [name] have been scheduled for an endoscopy and colonoscopy. Please follow the written instructions given to you at your visit today. Please pick up your prep supplies at the pharmacy within the next 1-3 days. If you use inhalers (even only as needed), please bring them with you on the day of your procedure.  Your provider has requested that you go to the basement level for lab work before leaving today. Press "B" on the elevator. The lab is located at the first door on the left as you exit the elevator.  Due to recent changes in healthcare laws, you may see the results of your imaging and laboratory studies on MyChart before your provider has had a chance to review them.  We understand that in some cases there may be results that are confusing or concerning to you. Not all laboratory results come back in the same time frame and the provider may be waiting for multiple results in order to interpret others.  Please give [place] 48 hours in order for your provider to thoroughly review  all the results before contacting the office for clarification of your results.   You have been scheduled for an Upper GI Series at [facility]. Your appointment is on 02-27-2021 at [place]. Please arrive 30 minutes prior to your test for registration. Make sure not to eat or drink anything after midnight on the night before your test. If you need to reschedule, please call radiology at [phone]. ________________________________________________________________ An upper GI series uses x rays to help diagnose problems of the upper GI tract, which includes the esophagus, stomach, and duodenum. The duodenum is the first part of the small intestine. An upper GI series is conducted by a radiology technologist or a radiologist--a doctor who specializes in x-ray imaging--at a hospital or outpatient center. While sitting or standing in front of an x-ray machine, the patient drinks barium liquid, which is often white and has a chalky consistency and taste. The barium liquid coats the lining of the upper GI tract and makes signs of disease show up more clearly on x rays. X-ray video, called fluoroscopy, is used to view the barium liquid moving through the esophagus, stomach, and duodenum. Additional x rays and fluoroscopy are performed while the patient lies on an x-ray table. To fully coat the upper GI tract with barium liquid, the technologist or radiologist may press on the abdomen or ask the patient to change position. Patients hold still in various positions, allowing the technologist or radiologist to take x rays of the upper GI tract at different angles. If a technologist conducts the  upper GI series, a radiologist will later examine the images to look for problems.  This test typically takes about 1 hour to complete. __________________________________________________________________   It was a pleasure to see you today!  Thank you for trusting me with your gastrointestinal care!

## 2021-02-19 NOTE — Progress Notes (Signed)
[facility] Consult Note:  History: [name] [name] 02/19/2021  Referring provider: Ignatius Specking, MD  Reason for consult/chief complaint: Change in bowel habits (Breaks out in sweats and diarrhea starts. Diarrhea is severe and can't control it. ) and Dysphagia (Sometimes food gets stuck in her throat.)   Subjective  HPI: I last saw [name] [name] in 2019 for consultation after prior GI care in [place].  She had chronic symptoms of regurgitation, pyrosis, belching and dysphagia.  Chronic anxiety and pain syndrome/fibromyalgia, other medical issues as noted below. EGD 09/26/2017 with large hiatal hernia (measured 8 to 10 cm axial dimension), with grade D esophagitis and stricture requiring balloon dilation.  Repeat EGD 11/10/2017 on high-dose acid suppression had similar findings.  Patient then sent for manometry, gastric emptying study and referral to surgery, and in January 2020 had the following procedure with Dr. Michaell Cowing: "1. ROBOTIC reduction of paraesophageal hiatal hernia 2. Type II mediastinal dissection. 3. Primary repair of hiatal hernia over pledgets.  4. Mesh reinforcement with absorbable Phasix mesh 5. Anterior & posterior gastropexy. 6. Toupet (partial posterior) fundoplication 2 cm over a 56-French bougie"  _________________________________  She contacted our office 10/31/2020 complaining of severe dysphagia and weight loss, she was given an appointment with [place] several days later, and subsequently canceled, telling me today "I just could not get down here".   It was challenging to get a clear and consistent history from [place], and her sister was with her for the entire visit. She has had chronic (more so episodic) diarrhea for a year or more that is sudden onset and associated with feeling of dizziness, sweats and lightheadedness, and on several occasions is then caused her to fall.  She showed me photographs of facial ecchymoses that occurred after these falls.   When the diarrhea occurs it is uncontrollable loose and she has soiled her self in room and several pairs of sweatpants.  She denies rectal bleeding or abdominal pain.  She has not had antibiotics that she can recall, no travel or sick contacts. She also has a significant amount of weight loss in the last 6 to 12 months, perhaps as much as 30 pounds.  She attributes this to dysphagia, where she can only eat several bites and either feels nauseated and has dysphagia.  She reports feeling great for about a year after the surgery by Dr. Michaell Cowing, then had recurrence of the symptoms. No rash, tongue or lip swelling with diarrhea episodes. Ahja says she has been telling her PCP about this for at least a year, they have attributed the symptoms to IBS, she has had blood work done periodically including a few months ago and was told it was all normal.  ROS:  Review of Systems  Constitutional:  Negative for appetite change and unexpected weight change.  HENT:  Negative for mouth sores and voice change.   Eyes:  Negative for pain and redness.  Respiratory:  Negative for cough and shortness of breath.   Cardiovascular:  Negative for chest pain and palpitations.  Genitourinary:  Negative for dysuria and hematuria.  Musculoskeletal:  Positive for arthralgias and back pain. Negative for myalgias.  Skin:  Negative for pallor and rash.  Neurological:  Negative for weakness and headaches.  Hematological:  Negative for adenopathy.  Psychiatric/Behavioral:         Anxiety and depression    Past Medical History: Past Medical History:  Diagnosis Date   Allergy    Anxiety    Complication  of anesthesia    Depression    Diverticulosis    Dysphagia    Epilepsy ([place])    Family history of adverse reaction to anesthesia    sisters with ponv   Fecal incontinence    occ   Fibromyalgia    Fractures    GERD (gastroesophageal reflux disease)    Last EGD with 2F dilation by Dr. Gala Romney 07/29/2006   GERD  (gastroesophageal reflux disease)    Hemorrhoids 10/2004   Otherwise normal colonoscopy by Dr. Gala Romney   Hepatitis C antibody test positive    confirmed positive ab during 07/2011 hospitalization   Hiatal hernia    Hypertension    IBS (irritable bowel syndrome)    Osteoarthritis    Osteopenia    Osteoporosis    Peptic stricture of esophagus    2006   Polysubstance abuse ([place])    positive UDS for cocaine, etoh abuse   PONV (postoperative nausea and vomiting)    Rheumatoid arthritis(714.0)    Schatzki's ring    multiple dilations   Seasonal allergies    Seizures ([place])    last seizure 2009   Sinus infection    finished antibiotic 02-16-18     Past Surgical History: Past Surgical History:  Procedure Laterality Date   APPENDECTOMY     BACK SURGERY     lower   COLONOSCOPY  10/08/10   [id number] diverticulosis otherwise normal colon and rectum   ESOPHAGEAL MANOMETRY N/A 11/30/2017   Procedure: ESOPHAGEAL MANOMETRY (EM);  Surgeon: Mauri Pole, MD;  Location: [facility];  Service: Endoscopy;  Laterality: N/A;   ESOPHAGOGASTRODUODENOSCOPY  10/08/10   [id number] hiatal hernia otherwise normal   EYE SURGERY     right cyst removed   LAPAROSCOPIC HYSTERECTOMY     left thumb surgery     NOSE SURGERY     RECTOCELE REPAIR     S/P Hysterectomy     complete   Thumb surgery Left    TUBAL LIGATION       Family History: Family History  Problem Relation Age of Onset   Leukemia Father    Alcohol abuse Father    Arthritis Father    Cancer Father    Alzheimer's disease Mother    Arthritis Mother    Depression Mother    Arthritis Sister    Diabetes Sister    Cancer Sister    Leukemia Sister    Anesthesia problems Neg Hx    Hypotension Neg Hx    Malignant hyperthermia Neg Hx    Pseudochol deficiency Neg Hx    Colon cancer Neg Hx    Esophageal cancer Neg Hx    Stomach cancer Neg Hx    Rectal cancer Neg Hx     Social History: Social History   Socioeconomic  History   Marital status: Divorced    Spouse name: Not on file   Number of children: 1   Years of education: Not on file   Highest education level: Not on file  Occupational History   Occupation: disability    Employer: NOT EMPLOYED  Tobacco Use   Smoking status: Never   Smokeless tobacco: Never   Tobacco comments:    quit as a teenager  Vaping Use   Vaping Use: Never used  Substance and Sexual Activity   Alcohol use: Yes    Comment: 6 beers per week   Drug use: No    Comment: h/o cocaine in past; none since june 2018   Sexual  activity: Not Currently  Other Topics Concern   Not on file  Social History Narrative   Lives alone   Social Determinants of Health   Financial Resource Strain: Not on file  Food Insecurity: Not on file  Transportation Needs: Not on file  Physical Activity: Not on file  Stress: Not on file  Social Connections: Not on file    Allergies: Allergies  Allergen Reactions   Morphine And Related Other (See Comments)    Hallucinations   Sulfonamide Derivatives Other (See Comments)    REACTION: UNKNOWN REACTION   Penicillins Other (See Comments)    Turned purple with Penicillin shot when younger Did it involve swelling of the face/tongue/throat, SOB, or low BP? no Did it involve sudden or severe rash/hives, skin peeling, or any reaction on the inside of your mouth or nose? no Did you need to seek medical attention at a hospital or doctor's office? yes When did it last happen?  1956     If all above answers are NO, may proceed with cephalosporin use.     Outpatient Meds: Current Outpatient Medications  Medication Sig Dispense Refill   ALPRAZolam (XANAX) 0.5 MG tablet Take 0.5 mg by mouth 3 (three) times daily as needed for anxiety.     Camphor-Eucalyptus-Menthol (VAPORX BALM EX) Apply 1 application topically as needed (dry skin).     dicyclomine (BENTYL) 10 MG capsule Take 1 capsule (10 mg total) by mouth 4 (four) times daily -  before meals  and at bedtime. 90 capsule 0   FLUoxetine (PROZAC) 20 MG tablet Take 20 mg by mouth daily.     lisinopril (ZESTRIL) 40 MG tablet Take 40 mg by mouth daily.     metoCLOPramide (REGLAN) 5 MG tablet Take 1 tablet (5 mg total) by mouth every 12 (twelve) hours as needed for up to 2 doses for nausea. Take 30-45 minutes before evening and AM doses of bowel preparation solution. 2 tablet 0   metoprolol tartrate (LOPRESSOR) 25 MG tablet Take 25 mg by mouth daily.     Na Sulfate-K Sulfate-Mg Sulf 17.5-3.13-1.6 GM/177ML SOLN Take 1 kit by mouth once for 1 dose. 354 mL 0   oxybutynin (DITROPAN XL) 15 MG 24 hr tablet Take 15 mg by mouth at bedtime.     primidone (MYSOLINE) 250 MG tablet Take 250 mg by mouth 3 (three) times daily.     No current facility-administered medications for this visit.      ___________________________________________________________________ Objective   Exam:  BP 110/64    Pulse 75    Ht $R'5\' 2"'LG$  (1.575 m)    Wt 110 lb (49.9 kg)    SpO2 97%    BMI 20.12 kg/m  Wt Readings from Last 3 Encounters:  02/19/21 110 lb (49.9 kg)  06/17/20 150 lb (68 kg)  02/22/18 151 lb (68.5 kg)    General: Not acutely ill-appearing.  Petite and thin with poor muscle mass, kyphosis Eyes: sclera anicteric, no redness ENT: oral mucosa moist without lesions, no cervical or supraclavicular lymphadenopathy CV: RRR without murmur, S1/S2, no JVD, no peripheral edema Resp: clear to auscultation bilaterally, normal RR and effort noted GI: soft, mild epigastric tenderness, with active bowel sounds. No guarding or palpable organomegaly noted.  No abdominal bruit Skin; warm and dry, no rash or jaundice noted Neuro: awake, alert and oriented x 3. Normal gross motor function and fluent speech  Labs:  No recent data for review, no PCP records available Assessment: Encounter Diagnoses  Name  Primary?   Chronic diarrhea Yes   Tenesmus    Esophageal dysphagia    Weight loss     Chronic episodic diarrhea  with urgency and incontinence, associated vagal symptoms.  No flushing or rash to suggest allergic or carcinoid.  She also says she has IBS with episodic constipation and needs to take a Linzess every month or so, but does not like to do so because it causes loose stool.  Her weight loss is quite worrisome and seems largely related to the dysphagia.  She may have had recurrence of hiatal hernia or esophageal or gastric dysmotility from that or the fundoplication.  This could lead to achalasia-like picture, gastroparesis or dumping syndrome.  Plan:  Upper GI series  EGD and colonoscopy.  She was agreeable after discussion of procedure and risks.  The benefits and risks of the planned procedure were described in detail with the patient or (when appropriate) their health care proxy.  Risks were outlined as including, but not limited to, bleeding, infection, perforation, adverse medication reaction leading to cardiac or pulmonary decompensation, pancreatitis (if ERCP).  The limitation of incomplete mucosal visualization was also discussed.  No guarantees or warranties were given.  Labs today: CBC, CMP, TSH/free T4  Trial of dicyclomine 10 mg 2-3 times a day before meals   Nelida Meuse III  CC: Referring provider noted above

## 2021-02-20 ENCOUNTER — Encounter: Payer: Self-pay | Admitting: Diagnostic Neuroimaging

## 2021-02-20 ENCOUNTER — Ambulatory Visit: Payer: Medicare Other | Admitting: Diagnostic Neuroimaging

## 2021-02-20 ENCOUNTER — Telehealth: Payer: Self-pay | Admitting: Gastroenterology

## 2021-02-20 VITALS — BP 131/75 | HR 69 | Ht 61.0 in | Wt 113.0 lb

## 2021-02-20 DIAGNOSIS — G40909 Epilepsy, unspecified, not intractable, without status epilepticus: Secondary | ICD-10-CM | POA: Diagnosis not present

## 2021-02-20 NOTE — Telephone Encounter (Signed)
Patient called and stated that she forgot to get her labs done yesterday. Wanted to inform Dr. Loletha Carrow she will be back to have them done.

## 2021-02-20 NOTE — Progress Notes (Signed)
[facility]  PATIENT: [name] [name] DOB: Jun 01, 1955  REFERRING CLINICIAN: Glenda Chroman, MD HISTORY FROM: patient  REASON FOR VISIT: new consult    HISTORICAL  CHIEF COMPLAINT:  Chief Complaint  Patient presents with   New Patient (Initial Visit)    Pt in room 6  Pt is here for seizures and tremors . Pt states she has not had a seizure in 5 years . Pt states she has tremors in both hands and arms . Pt states she drops things and its hard for her to eat and drink because she spills food and drink everywhere     HISTORY OF PRESENT ILLNESS:   66 year old female here for evaluation of seizure disorder.  Patient had seizure onset around age 49 years old with grand mal seizures.  She was tried on Dilantin and phenobarbital but ultimately switched to primidone.  She has been on primidone for at least the past 40 years.  She is on primidone 250 mg twice a day (sometimes she takes 3 times a day).  Last seizure was around 2018.  She has seizures about every 5 to 8 years.  Last saw neurologist around 10 years ago.  Medications have been prescribed by PCP since that time.  Patient also has postural tremor of hands and voice.  She also has anxiety.  Has been on antianxiety medicines for many years well.     REVIEW OF SYSTEMS: Full 14 system review of systems performed and negative with exception of: as per HPI.  ALLERGIES: Allergies  Allergen Reactions   Morphine And Related Other (See Comments)    Hallucinations   Sulfonamide Derivatives Other (See Comments)    REACTION: UNKNOWN REACTION   Penicillins Other (See Comments)    Turned purple with Penicillin shot when younger Did it involve swelling of the face/tongue/throat, SOB, or low BP? no Did it involve sudden or severe rash/hives, skin peeling, or any reaction on the inside of your mouth or nose? no Did you need to seek medical attention at a hospital or doctor's office? yes When did it last happen?  1956      If all above answers are NO, may proceed with cephalosporin use.     HOME MEDICATIONS: Outpatient Medications Prior to Visit  Medication Sig Dispense Refill   ALPRAZolam (XANAX) 0.5 MG tablet Take 0.5 mg by mouth 3 (three) times daily as needed for anxiety.     dicyclomine (BENTYL) 10 MG capsule Take 1 capsule (10 mg total) by mouth 4 (four) times daily -  before meals and at bedtime. 90 capsule 0   FLUoxetine (PROZAC) 20 MG tablet Take 20 mg by mouth daily.     lisinopril (ZESTRIL) 40 MG tablet Take 40 mg by mouth daily.     metoCLOPramide (REGLAN) 5 MG tablet Take 1 tablet (5 mg total) by mouth every 12 (twelve) hours as needed for up to 2 doses for nausea. Take 30-45 minutes before evening and AM doses of bowel preparation solution. 2 tablet 0   metoprolol tartrate (LOPRESSOR) 25 MG tablet Take 25 mg by mouth daily.     oxybutynin (DITROPAN XL) 15 MG 24 hr tablet Take 15 mg by mouth at bedtime.     primidone (MYSOLINE) 250 MG tablet Take 250 mg by mouth 3 (three) times daily.     Camphor-Eucalyptus-Menthol (VAPORX BALM EX) Apply 1 application topically as needed (dry skin).     No facility-administered medications prior to visit.  PAST MEDICAL HISTORY: Past Medical History:  Diagnosis Date   Allergy    Anxiety    Complication of anesthesia    Depression    Diverticulosis    Dysphagia    Epilepsy ([place])    Family history of adverse reaction to anesthesia    sisters with ponv   Fecal incontinence    occ   Fibromyalgia    Fractures    GERD (gastroesophageal reflux disease)    Last EGD with 71F dilation by Dr. Gala Romney 07/29/2006   GERD (gastroesophageal reflux disease)    Hemorrhoids 10/2004   Otherwise normal colonoscopy by Dr. Gala Romney   Hepatitis C antibody test positive    confirmed positive ab during 07/2011 hospitalization   Hiatal hernia    Hypertension    IBS (irritable bowel syndrome)    Osteoarthritis    Osteopenia    Osteoporosis    Peptic stricture of  esophagus    2006   Polysubstance abuse ([place])    positive UDS for cocaine, etoh abuse   PONV (postoperative nausea and vomiting)    Rheumatoid arthritis(714.0)    Schatzki's ring    multiple dilations   Seasonal allergies    Seizures ([place])    last seizure 2009   Sinus infection    finished antibiotic 02-16-18    PAST SURGICAL HISTORY: Past Surgical History:  Procedure Laterality Date   APPENDECTOMY     BACK SURGERY     lower   COLONOSCOPY  10/08/10   IZT:[id number] diverticulosis otherwise normal colon and rectum   ESOPHAGEAL MANOMETRY N/A 11/30/2017   Procedure: ESOPHAGEAL MANOMETRY (EM);  Surgeon: Mauri Pole, MD;  Location: [facility];  Service: Endoscopy;  Laterality: N/A;   ESOPHAGOGASTRODUODENOSCOPY  10/08/10   ASN:KNLZJ hiatal hernia otherwise normal   EYE SURGERY     right cyst removed   LAPAROSCOPIC HYSTERECTOMY     left thumb surgery     NOSE SURGERY     RECTOCELE REPAIR     S/P Hysterectomy     complete   Thumb surgery Left    TUBAL LIGATION      FAMILY HISTORY: Family History  Problem Relation Age of Onset   Leukemia Father    Alcohol abuse Father    Arthritis Father    Cancer Father    Alzheimer's disease Mother    Arthritis Mother    Depression Mother    Arthritis Sister    Diabetes Sister    Cancer Sister    Leukemia Sister    Anesthesia problems Neg Hx    Hypotension Neg Hx    Malignant hyperthermia Neg Hx    Pseudochol deficiency Neg Hx    Colon cancer Neg Hx    Esophageal cancer Neg Hx    Stomach cancer Neg Hx    Rectal cancer Neg Hx     SOCIAL HISTORY: Social History   Socioeconomic History   Marital status: Divorced    Spouse name: Not on file   Number of children: 1   Years of education: Not on file   Highest education level: Not on file  Occupational History   Occupation: disability    Employer: NOT EMPLOYED  Tobacco Use   Smoking status: Never   Smokeless tobacco: Never   Tobacco comments:    quit as a teenager   Vaping Use   Vaping Use: Never used  Substance and Sexual Activity   Alcohol use: Yes    Alcohol/week: 5.0 standard drinks  Types: 5 Cans of beer per week    Comment: 6 beers per week   Drug use: No    Comment: h/o cocaine in past; none since june 2018   Sexual activity: Not Currently  Other Topics Concern   Not on file  Social History Narrative   Lives alone   Social Determinants of Health   Financial Resource Strain: Not on file  Food Insecurity: Not on file  Transportation Needs: Not on file  Physical Activity: Not on file  Stress: Not on file  Social Connections: Not on file  Intimate Partner Violence: Not on file     PHYSICAL EXAM  GENERAL EXAM/CONSTITUTIONAL: Vitals:  Vitals:   02/20/21 1102  BP: 131/75  Pulse: 69  Weight: 113 lb (51.3 kg)  Height: 5\' 1"  (1.549 m)   Body mass index is 21.35 kg/m. Wt Readings from Last 3 Encounters:  02/20/21 113 lb (51.3 kg)  02/19/21 110 lb (49.9 kg)  06/17/20 150 lb (68 kg)   Patient is in no distress; well developed, nourished and groomed; neck is supple  CARDIOVASCULAR: Examination of carotid arteries is normal; no carotid bruits Regular rate and rhythm, no murmurs Examination of peripheral vascular system by observation and palpation is normal  EYES: Ophthalmoscopic exam of optic discs and posterior segments is normal; no papilledema or hemorrhages No results found.  MUSCULOSKELETAL: Gait, strength, tone, movements noted in Neurologic exam below  NEUROLOGIC: MENTAL STATUS:  No flowsheet data found. awake, alert, oriented to person, place and time recent and remote memory intact normal attention and concentration language fluent, comprehension intact, naming intact fund of knowledge appropriate  CRANIAL NERVE:  2nd - no papilledema on fundoscopic exam 2nd, 3rd, 4th, 6th - pupils equal and reactive to light, visual fields full to confrontation, extraocular muscles intact, no nystagmus 5th - facial  sensation symmetric 7th - facial strength symmetric 8th - hearing intact 9th - palate elevates symmetrically, uvula midline 11th - shoulder shrug symmetric 12th - tongue protrusion midline MILD VOICE TREMOR  MOTOR:  normal bulk and tone, full strength in the BUE, BLE POSTURAL TREMOR IN HANDS  SENSORY:  normal and symmetric to light touch  COORDINATION:  finger-nose-finger, fine finger movements normal  REFLEXES:  deep tendon reflexes TRACE and symmetric  GAIT/STATION:  narrow based gait     DIAGNOSTIC DATA (LABS, IMAGING, TESTING) - I reviewed patient records, labs, notes, testing and imaging myself where available.  Lab Results  Component Value Date   WBC 3.7 (L) 02/17/2018   HGB 10.3 (L) 02/17/2018   HCT 34.8 (L) 02/17/2018   MCV 82.3 02/17/2018   PLT 269 02/17/2018      Component Value Date/Time   NA 134 (L) 02/17/2018 1212   K 4.8 02/17/2018 1212   CL 100 02/17/2018 1212   CO2 24 02/17/2018 1212   GLUCOSE 96 02/17/2018 1212   BUN 12 02/17/2018 1212   CREATININE 0.66 02/17/2018 1212   CALCIUM 8.8 (L) 02/17/2018 1212   PROT 7.7 02/17/2018 1212   ALBUMIN 4.5 02/17/2018 1212   AST 28 02/17/2018 1212   ALT 22 02/17/2018 1212   ALKPHOS 139 (H) 02/17/2018 1212   BILITOT 0.4 02/17/2018 1212   GFRNONAA >60 02/17/2018 1212   GFRAA >60 02/17/2018 1212   No results found for: CHOL, HDL, LDLCALC, LDLDIRECT, TRIG, CHOLHDL No results found for: HGBA1C No results found for: VITAMINB12 Lab Results  Component Value Date   TSH 2.833 07/16/2011     05/24/07 MRI brain [  I reviewed images myself and agree with interpretation. -VRP]  - Normal examination     ASSESSMENT AND PLAN  66 y.o. year old female here with:   Dx:  1. Seizure disorder [facility])     PLAN:  SEIZURE DISORDER (since age 67 years old; grand mal seizures; last seizure 2018) - has been stable on primidone 250mg  twice a day (sometimes takes three times a day) - continue current dosing; patient  not interested to change meds  ANXIETY DISORDER - on prozac and xanax per PCP; consider psychiatry referral  Return for return to PCP, pending if symptoms worsen or fail to improve.    Penni Bombard, MD 7/61/9509, 32:67 AM Certified in Neurology, Neurophysiology and Neuroimaging  [facility] [address], [place], [place] [address] [phone]

## 2021-02-27 ENCOUNTER — Ambulatory Visit (HOSPITAL_COMMUNITY): Payer: Medicare Other

## 2021-03-02 ENCOUNTER — Ambulatory Visit (HOSPITAL_COMMUNITY)
Admission: RE | Admit: 2021-03-02 | Discharge: 2021-03-02 | Disposition: A | Discharge status: Home / Self Care | Payer: Medicare Other | Source: Ambulatory Visit | Attending: Gastroenterology | Admitting: Gastroenterology

## 2021-03-02 ENCOUNTER — Other Ambulatory Visit: Payer: Self-pay

## 2021-03-02 DIAGNOSIS — R198 Other specified symptoms and signs involving the digestive system and abdomen: Secondary | ICD-10-CM | POA: Diagnosis not present

## 2021-03-02 DIAGNOSIS — K529 Noninfective gastroenteritis and colitis, unspecified: Secondary | ICD-10-CM | POA: Diagnosis not present

## 2021-03-02 DIAGNOSIS — R1319 Other dysphagia: Secondary | ICD-10-CM | POA: Insufficient documentation

## 2021-03-02 DIAGNOSIS — K224 Dyskinesia of esophagus: Secondary | ICD-10-CM | POA: Diagnosis not present

## 2021-03-02 DIAGNOSIS — K449 Diaphragmatic hernia without obstruction or gangrene: Secondary | ICD-10-CM | POA: Diagnosis not present

## 2021-04-01 ENCOUNTER — Encounter: Payer: Self-pay | Admitting: Gastroenterology

## 2021-04-07 ENCOUNTER — Encounter: Payer: Self-pay | Admitting: Gastroenterology

## 2021-04-07 ENCOUNTER — Ambulatory Visit (AMBULATORY_SURGERY_CENTER): Payer: Medicare Other | Admitting: Gastroenterology

## 2021-04-07 VITALS — BP 166/90 | HR 77 | Temp 98.7°F | Resp 11 | Ht 62.0 in | Wt 110.0 lb

## 2021-04-07 DIAGNOSIS — R1319 Other dysphagia: Secondary | ICD-10-CM | POA: Diagnosis not present

## 2021-04-07 DIAGNOSIS — K529 Noninfective gastroenteritis and colitis, unspecified: Secondary | ICD-10-CM

## 2021-04-07 DIAGNOSIS — D128 Benign neoplasm of rectum: Secondary | ICD-10-CM | POA: Diagnosis not present

## 2021-04-07 DIAGNOSIS — R634 Abnormal weight loss: Secondary | ICD-10-CM | POA: Diagnosis not present

## 2021-04-07 DIAGNOSIS — K625 Hemorrhage of anus and rectum: Secondary | ICD-10-CM | POA: Diagnosis not present

## 2021-04-07 MED ORDER — SODIUM CHLORIDE 0.9 % IV SOLN
500.0000 mL | Freq: Once | INTRAVENOUS | Status: DC
Start: 2021-04-07 — End: 2021-04-07

## 2021-04-07 NOTE — Op Note (Signed)
[place] ?Patient Name: [name] [name] ?Procedure Date: 04/07/2021 9:00 AM ?MRN: [id number] ?Endoscopist: Estill Cotta. Loletha Carrow , MD ?Age: 66 ?Referring MD:  ?Date of Birth: [birth year] ?Gender: Female ?Account #: [id number] ?Procedure:                Colonoscopy ?Indications:              Chronic diarrhea, Weight loss ?Medicines:                Monitored Anesthesia Care ?Procedure:                Pre-Anesthesia Assessment: ?                          - Prior to the procedure, a History and Physical  ?                          was performed, and patient medications and  ?                          allergies were reviewed. The patient's tolerance of  ?                          previous anesthesia was also reviewed. The risks  ?                          and benefits of the procedure and the sedation  ?                          options and risks were discussed with the patient.  ?                          All questions were answered, and informed consent  ?                          was obtained. Prior Anticoagulants: The patient has  ?                          taken no previous anticoagulant or antiplatelet  ?                          agents. ASA Grade Assessment: III - A patient with  ?                          severe systemic disease. After reviewing the risks  ?                          and benefits, the patient was deemed in  ?                          satisfactory condition to undergo the procedure. ?                          After obtaining informed consent, the colonoscope  ?  was passed under direct vision. Throughout the  ?                          procedure, the patient's blood pressure, pulse, and  ?                          oxygen saturations were monitored continuously. The  ?                          [id number]-HQ190L Colonoscope was introduced through the  ?                          anus and advanced to the the terminal ileum, with  ?                          identification of the  appendiceal orifice and IC  ?                          valve. The colonoscopy was somewhat difficult due  ?                          to multiple diverticula in the colon and a tortuous  ?                          colon. Successful completion of the procedure was  ?                          aided by using manual pressure and lavage. The  ?                          patient tolerated the procedure well. The quality  ?                          of the bowel preparation was fair. The terminal  ?                          ileum, ileocecal valve, appendiceal orifice, and  ?                          rectum were photographed. ?Scope In: 9:33:13 AM ?Scope Out: 9:52:21 AM ?Scope Withdrawal Time: 0 hours 11 minutes 34 seconds  ?Total Procedure Duration: 0 hours 19 minutes 8 seconds  ?Findings:                 The perianal and digital rectal examinations were  ?                          normal. ?                          The terminal ileum appeared normal. ?                          Normal mucosa was found in the entire colon.  ?  Biopsies for histology were taken with a cold  ?                          forceps from the right colon and left colon for  ?                          evaluation of microscopic colitis. ?                          Diverticula were found in the sigmoid colon. ?                          A diminutive polyp was found in the rectum. The  ?                          polyp was sessile. The polyp was removed with a  ?                          cold biopsy forceps. Resection and retrieval were  ?                          complete. ?                          The exam was otherwise without abnormality on  ?                          direct and retroflexion views. ?Complications:            No immediate complications. ?Estimated Blood Loss:     Estimated blood loss was minimal. ?Impression:               - Preparation of the colon was fair. ?                          - The examined portion of the  ileum was normal. ?                          - Normal mucosa in the entire examined colon.  ?                          Biopsied. ?                          - Diverticulosis in the sigmoid colon. ?                          - One diminutive polyp in the rectum, removed with  ?                          a cold biopsy forceps. Resected and retrieved. ?                          - The examination was otherwise normal on direct  ?  and retroflexion views. ?Recommendation:           - Patient has a contact number available for  ?                          emergencies. The signs and symptoms of potential  ?                          delayed complications were discussed with the  ?                          patient. Return to normal activities tomorrow.  ?                          Written discharge instructions were provided to the  ?                          patient. ?                          - Resume previous diet. ?                          - Continue present medications. ?                          - Await pathology results. ?                          - Repeat colonoscopy in 3 years for surveillance,  ?                          even if polyp hyperplastic (fair prep). Split-dose  ?                          golytely for next colonoscopy. ?                          - See the other procedure note for documentation of  ?                          additional recommendations. ?Desman Polak L. Loletha Carrow, MD ?04/07/2021 10:07:26 AM ?This report has been signed electronically. ?

## 2021-04-07 NOTE — Progress Notes (Signed)
Called to room to assist during endoscopic procedure.  Patient ID and intended procedure confirmed with present staff. Received instructions for my participation in the procedure from the performing physician.  

## 2021-04-07 NOTE — Progress Notes (Signed)
Pt's states no medical or surgical changes since previsit or office visit.  VS CW  

## 2021-04-07 NOTE — Progress Notes (Signed)
To pacu, VSS. Report to [email or web address] 

## 2021-04-07 NOTE — Op Note (Signed)
[place] ?Patient Name: [name] [name] ?Procedure Date: 04/07/2021 9:00 AM ?MRN: [id number] ?Endoscopist: Estill Cotta. Loletha Carrow , MD ?Age: 66 ?Referring MD:  ?Date of Birth: 10-May-1955 ?Gender: Female ?Account #: [id number] ?Procedure:                Upper GI endoscopy ?Indications:              Esophageal dysphagia, Weight loss, chronic diarrhea ?                          2019 hiatal hernia repair and toupee funoplication ?                          recent UGIS suggests esophageal dysmotility with  ?                          slow passage liquid, normal passage tablet ?Medicines:                Monitored Anesthesia Care ?Procedure:                Pre-Anesthesia Assessment: ?                          - Prior to the procedure, a History and Physical  ?                          was performed, and patient medications and  ?                          allergies were reviewed. The patient's tolerance of  ?                          previous anesthesia was also reviewed. The risks  ?                          and benefits of the procedure and the sedation  ?                          options and risks were discussed with the patient.  ?                          All questions were answered, and informed consent  ?                          was obtained. Prior Anticoagulants: The patient has  ?                          taken no previous anticoagulant or antiplatelet  ?                          agents. ASA Grade Assessment: III - A patient with  ?                          severe systemic disease. After reviewing the risks  ?  and benefits, the patient was deemed in  ?                          satisfactory condition to undergo the procedure. ?                          After obtaining informed consent, the endoscope was  ?                          passed under direct vision. Throughout the  ?                          procedure, the patient's blood pressure, pulse, and  ?                          oxygen  saturations were monitored continuously. The  ?                          GIF HQ190 #[id number] was introduced through the  ?                          mouth, and advanced to the second part of duodenum.  ?                          The upper GI endoscopy was accomplished without  ?                          difficulty. The patient tolerated the procedure  ?                          well. ?Scope In: ?Scope Out: ?Findings:                 There is no endoscopic evidence of Barrett's  ?                          esophagus, esophagitis, areas of dilation or lower  ?                          esophageal sphincter tone abnormalities in the  ?                          entire esophagus. Active motility was seen. Scope  ?                          passed easily. ?                          The stomach was normal (except for one of the  ?                          patient's intact pills seen). ?                          The cardia and gastric fundus were normal on  ?  retroflexion other than evidence of an intact  ?                          fundoplication. ?                          Normal mucosa was found in the entire duodenum.  ?                          Biopsies for histology were taken with a cold  ?                          forceps for evaluation of celiac disease. ?Complications:            No immediate complications. ?Estimated Blood Loss:     Estimated blood loss was minimal. ?Impression:               - Normal stomach. ?                          - Normal mucosa was found in the entire examined  ?                          duodenum. Biopsied. ?                          Nonspecific esophageal dysmotility after prior  ?                          fundoplication. No esophageal stricture. UGIS shows  ?                          liqiud passage slow. ?Recommendation:           - Patient has a contact number available for  ?                          emergencies. The signs and symptoms of potential  ?                           delayed complications were discussed with the  ?                          patient. Return to normal activities tomorrow.  ?                          Written discharge instructions were provided to the  ?                          patient. ?                          - Resume previous diet. Food must be cut and chewed  ?                          well with sips of liquid after every swallow. ?                          -  Continue present medications. ?                          - Await pathology results. ?                          - Do a gastric emptying study. ?Daion Ginsberg L. Loletha Carrow, MD ?04/07/2021 10:16:08 AM ?This report has been signed electronically. ?

## 2021-04-07 NOTE — Patient Instructions (Addendum)
Handouts provided on polyps and diverticulosis.  ? ?Resume previous diet. Food must be cut and chewed well with sips of liquid after every swallow.  ? ?Plan for a gastric emptying study. My nurse will contact you to schedule this.  ? ?YOU HAD AN ENDOSCOPIC PROCEDURE TODAY AT [facility]:   Refer to the procedure report that was given to you for any specific questions about what was found during the examination.  If the procedure report does not answer your questions, please call your gastroenterologist to clarify.  If you requested that your care partner not be given the details of your procedure findings, then the procedure report has been included in a sealed envelope for you to review at your convenience later. ? ?YOU SHOULD EXPECT: Some feelings of bloating in the abdomen. Passage of more gas than usual.  Walking can help get rid of the air that was put into your GI tract during the procedure and reduce the bloating. If you had a lower endoscopy (such as a colonoscopy or flexible sigmoidoscopy) you may notice spotting of blood in your stool or on the toilet paper. If you underwent a bowel prep for your procedure, you may not have a normal bowel movement for a few days. ? ?Please Note:  You might notice some irritation and congestion in your nose or some drainage.  This is from the oxygen used during your procedure.  There is no need for concern and it should clear up in a day or so. ? ?SYMPTOMS TO REPORT IMMEDIATELY: ? ?Following lower endoscopy (colonoscopy or flexible sigmoidoscopy): ? Excessive amounts of blood in the stool ? Significant tenderness or worsening of abdominal pains ? Swelling of the abdomen that is new, acute ? Fever of 100?F or higher ? ?Following upper endoscopy (EGD) ? Vomiting of blood or coffee ground material ? New chest pain or pain under the shoulder blades ? Painful or persistently difficult swallowing ? New shortness of breath ? Fever of 100?F or higher ? Black,  tarry-looking stools ? ?For urgent or emergent issues, a gastroenterologist can be reached at any hour by calling [phone]. ?Do not use MyChart messaging for urgent concerns.  ? ? ?DIET:  We do recommend a small meal at first, but then you may proceed to your regular diet.  Drink plenty of fluids but you should avoid alcoholic beverages for 24 hours. ? ?ACTIVITY:  You should plan to take it easy for the rest of today and you should NOT DRIVE or use heavy machinery until tomorrow (because of the sedation medicines used during the test).   ? ?FOLLOW UP: ?Our staff will call the number listed on your records 48-72 hours following your procedure to check on you and address any questions or concerns that you may have regarding the information given to you following your procedure. If we do not reach you, we will leave a message.  We will attempt to reach you two times.  During this call, we will ask if you have developed any symptoms of COVID 19. If you develop any symptoms (ie: fever, flu-like symptoms, shortness of breath, cough etc.) before then, please call [phone].  If you test positive for Covid 19 in the 2 weeks post procedure, please call and report this information to [place].   ? ?If any biopsies were taken you will be contacted by phone or by letter within the next 1-3 weeks.  Please call us at [phone] if you have not  heard about the biopsies in 3 weeks.  ? ? ?SIGNATURES/CONFIDENTIALITY: ?You and/or your care partner have signed paperwork which will be entered into your electronic medical record.  These signatures attest to the fact that that the information above on your After Visit Summary has been reviewed and is understood.  Full responsibility of the confidentiality of this discharge information lies with you and/or your care-partner. ? ?

## 2021-04-07 NOTE — Progress Notes (Signed)
History and Physical: ? This patient presents for endoscopic testing for: ?Encounter Diagnoses  ?Name Primary?  ? Chronic diarrhea Yes  ? Esophageal dysphagia   ? Weight loss   ? ? ?Clinical details in 02/19/21 clinic note. ?No changes since then ? ?ROS: ?Patient denies chest pain or shortness of breath ? ? ?Past Medical History: ?Past Medical History:  ?Diagnosis Date  ? Allergy   ? Anxiety   ? Complication of anesthesia   ? Depression   ? Diverticulosis   ? Dysphagia   ? Epilepsy ([place])   ? Family history of adverse reaction to anesthesia   ? sisters with ponv  ? Fecal incontinence   ? occ  ? Fibromyalgia   ? Fractures   ? GERD (gastroesophageal reflux disease)   ? Last EGD with 59F dilation by Dr. Gala Romney 07/29/2006  ? GERD (gastroesophageal reflux disease)   ? Hemorrhoids 10/2004  ? Otherwise normal colonoscopy by Dr. Gala Romney  ? Hepatitis C antibody test positive   ? confirmed positive ab during 07/2011 hospitalization  ? Hiatal hernia   ? Hypertension   ? IBS (irritable bowel syndrome)   ? Osteoarthritis   ? Osteopenia   ? Osteoporosis   ? Peptic stricture of esophagus   ? 2006  ? Polysubstance abuse ([place])   ? positive UDS for cocaine, etoh abuse  ? PONV (postoperative nausea and vomiting)   ? Rheumatoid arthritis(714.0)   ? Schatzki's ring   ? multiple dilations  ? Seasonal allergies   ? Seizures ([place])   ? last seizure 2009  ? Sinus infection   ? finished antibiotic 02-16-18  ? ? ? ?Past Surgical History: ?Past Surgical History:  ?Procedure Laterality Date  ? APPENDECTOMY    ? BACK SURGERY    ? lower  ? COLONOSCOPY  10/08/10  ? IOE:[id number] diverticulosis otherwise normal colon and rectum  ? ESOPHAGEAL MANOMETRY N/A 11/30/2017  ? Procedure: ESOPHAGEAL MANOMETRY (EM);  Surgeon: Mauri Pole, MD;  Location: [facility];  Service: Endoscopy;  Laterality: N/A;  ? ESOPHAGOGASTRODUODENOSCOPY  10/08/10  ? WEX:HBZJI hiatal hernia otherwise normal  ? EYE SURGERY    ? right cyst removed  ? LAPAROSCOPIC HYSTERECTOMY    ?  left thumb surgery    ? NOSE SURGERY    ? RECTOCELE REPAIR    ? S/P Hysterectomy    ? complete  ? Thumb surgery Left   ? TUBAL LIGATION    ? ? ?Allergies: ?Allergies  ?Allergen Reactions  ? Morphine And Related Other (See Comments)  ?  Hallucinations  ? Sulfonamide Derivatives Other (See Comments)  ?  REACTION: UNKNOWN REACTION  ? Penicillins Other (See Comments)  ?  Turned purple with Penicillin shot when younger ?Did it involve swelling of the face/tongue/throat, SOB, or low BP? no ?Did it involve sudden or severe rash/hives, skin peeling, or any reaction on the inside of your mouth or nose? no ?Did you need to seek medical attention at a hospital or doctor's office? yes ?When did it last happen?  1956     ?If all above answers are ?NO?, may proceed with cephalosporin use. ?  ? ? ?Outpatient Meds: ?Current Outpatient Medications  ?Medication Sig Dispense Refill  ? ALPRAZolam (XANAX) 0.5 MG tablet Take 0.5 mg by mouth 3 (three) times daily as needed for anxiety.    ? dicyclomine (BENTYL) 10 MG capsule Take 1 capsule (10 mg total) by mouth 4 (four) times daily -  before meals and at bedtime. [place]  capsule 0  ? FLUoxetine (PROZAC) 20 MG capsule Take 20 mg by mouth daily.    ? ibuprofen (ADVIL) 400 MG tablet Take 400 mg by mouth every 6 (six) hours as needed.    ? lisinopril (ZESTRIL) 40 MG tablet Take 40 mg by mouth daily.    ? metoCLOPramide (REGLAN) 5 MG tablet Take 1 tablet (5 mg total) by mouth every 12 (twelve) hours as needed for up to 2 doses for nausea. Take 30-45 minutes before evening and AM doses of bowel preparation solution. 2 tablet 0  ? metoprolol tartrate (LOPRESSOR) 25 MG tablet Take 25 mg by mouth daily.    ? oxybutynin (DITROPAN XL) 15 MG 24 hr tablet Take 15 mg by mouth at bedtime.    ? primidone (MYSOLINE) 250 MG tablet Take 250 mg by mouth 3 (three) times daily.    ? ?Current Facility-Administered Medications  ?Medication Dose Route Frequency Provider Last Rate Last Admin  ? 0.9 %  sodium chloride  infusion  500 mL Intravenous Once Doran Stabler, MD      ? ? ? ? ?___________________________________________________________________ ?Objective  ? ?Exam: ? ?BP (!) 150/70   Pulse 74   Temp 98.7 ?F (37.1 ?C) (Temporal)   Ht '5\' 2"'$  (1.575 m)   Wt 110 lb (49.9 kg)   SpO2 94%   BMI 20.12 kg/m?  ? ?CV: RRR without murmur, S1/S2 ?Resp: clear to auscultation bilaterally, normal RR and effort noted ?GI: soft, no tenderness, with active bowel sounds. ? ? ?Assessment: ?Encounter Diagnoses  ?Name Primary?  ? Chronic diarrhea Yes  ? Esophageal dysphagia   ? Weight loss   ? ? ? ?Plan: ?Colonoscopy ?EGD ? ? ?The patient is appropriate for an endoscopic procedure in the ambulatory setting. ? ? - Wilfrid Lund, MD ? ? ? ? ?

## 2021-04-08 ENCOUNTER — Telehealth: Payer: Self-pay

## 2021-04-08 DIAGNOSIS — R1319 Other dysphagia: Secondary | ICD-10-CM

## 2021-04-08 DIAGNOSIS — K529 Noninfective gastroenteritis and colitis, unspecified: Secondary | ICD-10-CM

## 2021-04-08 DIAGNOSIS — R634 Abnormal weight loss: Secondary | ICD-10-CM

## 2021-04-08 NOTE — Telephone Encounter (Signed)
Per 04/07/21 procedure report - Do a gastric emptying study ? ?GES order in epic. Secure staff message has been sent to radiology scheduling to contact pt to set up appt. ? ?I called pt and informed her that she will be receiving a call within the next week to set up her appt. She has requested that this be done at [facility]. I checked with Elberta Fortis in radiology scheduling and he stated that they only perform the GES at [facility] at 7:30 am. I called the pt back and informed her of this information. She stated that she will try to get transportation through [facility], she hopes that they will be able to pick her up that early. I told her to call us back if she is not able to complete the test. Pt verbalized understanding and had no concerns at the end of the call. ?

## 2021-04-08 NOTE — Telephone Encounter (Signed)
I checked with April Pait in radiology scheduling and she stated that [facility] does do GES but they schedule their own patients. She told me that I could send a staff message to [employer]. I sent a secure staff message to Mickie Bail to see if her staff will contact pt to set up her appt. ?

## 2021-04-09 ENCOUNTER — Telehealth: Payer: Self-pay

## 2021-04-09 NOTE — Telephone Encounter (Signed)
Called [phone] and left a message we tried to reach pt for a follow up call. maw  ?

## 2021-04-09 NOTE — Telephone Encounter (Signed)
White, Brooks Sailors, Pearline Cables, RN ?I will give her a call. Thank you!   ?  ?   ?Previous Messages ?  ?----- Message -----  ?From: Yevette Edwards, RN  ?Sent: 04/08/2021   4:06 PM EST  ?To: Jerrye Noble  ?Subject: Gastric Emptying study appt                    ? ?Abbie Sons,  ? ?Is your staff able to contact this pt to set up a Gastric Emptying Study at [facility]. The patient would prefer to have done at your location since she lives in [place]. The order is in epic. Please let me know if there is something that I need to do.  ? ?Thanks  ?

## 2021-04-10 ENCOUNTER — Encounter: Payer: Self-pay | Admitting: Gastroenterology

## 2021-04-13 ENCOUNTER — Other Ambulatory Visit: Payer: Self-pay

## 2021-04-13 DIAGNOSIS — R198 Other specified symptoms and signs involving the digestive system and abdomen: Secondary | ICD-10-CM

## 2021-04-13 DIAGNOSIS — K529 Noninfective gastroenteritis and colitis, unspecified: Secondary | ICD-10-CM

## 2021-04-13 DIAGNOSIS — R634 Abnormal weight loss: Secondary | ICD-10-CM

## 2021-04-29 NOTE — Telephone Encounter (Signed)
From: Mickie Bail A  ?Sent: 04/28/2021   2:16 PM EDT  ?To: Yevette Edwards, RN  ?Subject: RE: Gastric Emptying study appt                ? ?Yes she can come to [facility] for this scan. We have called her multiple times. She has not returned any phone calls..  ? ? ?Dr. Loletha Carrow has been notified. ?

## 2021-05-01 ENCOUNTER — Other Ambulatory Visit: Payer: Self-pay | Admitting: Gastroenterology

## 2021-05-04 DIAGNOSIS — Z299 Encounter for prophylactic measures, unspecified: Secondary | ICD-10-CM | POA: Diagnosis not present

## 2021-05-04 DIAGNOSIS — I10 Essential (primary) hypertension: Secondary | ICD-10-CM | POA: Diagnosis not present

## 2021-05-13 ENCOUNTER — Telehealth: Payer: Self-pay

## 2021-05-13 NOTE — Telephone Encounter (Signed)
-----   Message from Jerrye Noble sent at 05/13/2021  4:02 PM EDT ----- ?Regarding: RE: Gastric Emptying study appt ?[phone] ? ? ?----- Message ----- ?From: Yevette Edwards, RN ?Sent: 05/13/2021   3:40 PM EDT ?To: Jerrye Noble ?Subject: RE: Gastric Emptying study appt               ? ?Hello, ?Thank you for your efforts. If I can reach her I will have her call you all. What number does she need to call? ?----- Message ----- ?From: Mickie Bail A ?Sent: 05/13/2021   2:37 PM EDT ?To: Yevette Edwards, RN ?Subject: RE: Gastric Emptying study appt               ? ?Hi, ? ?I have left a few voice mails for her to contact us for scheduling. I'm not sure if you can get in touch with her? ? ?Magda Paganini ?----- Message ----- ?From: Yevette Edwards, RN ?Sent: 04/08/2021   4:06 PM EDT ?To: Jerrye Noble ?Subject: Gastric Emptying study appt                   ? ?Abbie Sons, ? ?Is your staff able to contact this pt to set up a Gastric Emptying Study at [facility]. The patient would prefer to have done at your location since she lives in [place]. The order is in epic. Please let me know if there is something that I need to do. ? ?Thanks ? ? ? ? ?

## 2021-05-13 NOTE — Telephone Encounter (Signed)
Lm on vm for patient to return call 

## 2021-05-15 NOTE — Telephone Encounter (Signed)
Lm on vm for patient to return call 

## 2021-05-18 NOTE — Telephone Encounter (Signed)
No return call received. Letter mailed to patient.  

## 2021-06-02 DIAGNOSIS — Z0000 Encounter for general adult medical examination without abnormal findings: Secondary | ICD-10-CM | POA: Diagnosis not present

## 2021-06-02 DIAGNOSIS — M810 Age-related osteoporosis without current pathological fracture: Secondary | ICD-10-CM | POA: Diagnosis not present

## 2021-06-02 DIAGNOSIS — Z7189 Other specified counseling: Secondary | ICD-10-CM | POA: Diagnosis not present

## 2021-06-02 DIAGNOSIS — Z789 Other specified health status: Secondary | ICD-10-CM | POA: Diagnosis not present

## 2021-06-02 DIAGNOSIS — Z681 Body mass index (BMI) 19 or less, adult: Secondary | ICD-10-CM | POA: Diagnosis not present

## 2021-06-02 DIAGNOSIS — Z299 Encounter for prophylactic measures, unspecified: Secondary | ICD-10-CM | POA: Diagnosis not present

## 2021-06-02 DIAGNOSIS — I10 Essential (primary) hypertension: Secondary | ICD-10-CM | POA: Diagnosis not present

## 2021-06-09 ENCOUNTER — Encounter (HOSPITAL_COMMUNITY): Payer: Medicare Other

## 2021-07-28 DIAGNOSIS — I10 Essential (primary) hypertension: Secondary | ICD-10-CM | POA: Diagnosis not present

## 2021-07-28 DIAGNOSIS — Z299 Encounter for prophylactic measures, unspecified: Secondary | ICD-10-CM | POA: Diagnosis not present

## 2021-08-13 DIAGNOSIS — I959 Hypotension, unspecified: Secondary | ICD-10-CM | POA: Diagnosis not present

## 2021-09-25 DIAGNOSIS — I10 Essential (primary) hypertension: Secondary | ICD-10-CM | POA: Diagnosis not present

## 2021-09-25 DIAGNOSIS — R109 Unspecified abdominal pain: Secondary | ICD-10-CM | POA: Diagnosis not present

## 2021-09-25 DIAGNOSIS — Z0000 Encounter for general adult medical examination without abnormal findings: Secondary | ICD-10-CM | POA: Diagnosis not present

## 2021-09-25 DIAGNOSIS — Z299 Encounter for prophylactic measures, unspecified: Secondary | ICD-10-CM | POA: Diagnosis not present

## 2021-09-25 DIAGNOSIS — Z681 Body mass index (BMI) 19 or less, adult: Secondary | ICD-10-CM | POA: Diagnosis not present

## 2021-09-25 DIAGNOSIS — R5383 Other fatigue: Secondary | ICD-10-CM | POA: Diagnosis not present

## 2021-09-25 DIAGNOSIS — K589 Irritable bowel syndrome without diarrhea: Secondary | ICD-10-CM | POA: Diagnosis not present

## 2021-10-26 DIAGNOSIS — J069 Acute upper respiratory infection, unspecified: Secondary | ICD-10-CM | POA: Diagnosis not present

## 2021-10-26 DIAGNOSIS — I10 Essential (primary) hypertension: Secondary | ICD-10-CM | POA: Diagnosis not present

## 2021-10-26 DIAGNOSIS — Z299 Encounter for prophylactic measures, unspecified: Secondary | ICD-10-CM | POA: Diagnosis not present

## 2021-12-28 DIAGNOSIS — I10 Essential (primary) hypertension: Secondary | ICD-10-CM | POA: Diagnosis not present

## 2021-12-28 DIAGNOSIS — Z299 Encounter for prophylactic measures, unspecified: Secondary | ICD-10-CM | POA: Diagnosis not present

## 2022-01-04 DIAGNOSIS — Z1231 Encounter for screening mammogram for malignant neoplasm of breast: Secondary | ICD-10-CM | POA: Diagnosis not present

## 2022-03-22 DIAGNOSIS — S20219A Contusion of unspecified front wall of thorax, initial encounter: Secondary | ICD-10-CM | POA: Diagnosis not present

## 2022-03-22 DIAGNOSIS — M25461 Effusion, right knee: Secondary | ICD-10-CM | POA: Diagnosis not present

## 2022-03-22 DIAGNOSIS — S8391XD Sprain of unspecified site of right knee, subsequent encounter: Secondary | ICD-10-CM | POA: Diagnosis not present

## 2022-03-23 ENCOUNTER — Encounter: Payer: Medicare Other | Admitting: Orthopaedic Surgery

## 2022-04-01 ENCOUNTER — Encounter: Payer: Self-pay | Admitting: Radiology

## 2022-04-05 DIAGNOSIS — M25461 Effusion, right knee: Secondary | ICD-10-CM | POA: Diagnosis not present

## 2022-04-26 DIAGNOSIS — Z299 Encounter for prophylactic measures, unspecified: Secondary | ICD-10-CM | POA: Diagnosis not present

## 2022-04-26 DIAGNOSIS — I10 Essential (primary) hypertension: Secondary | ICD-10-CM | POA: Diagnosis not present

## 2022-04-26 DIAGNOSIS — R569 Unspecified convulsions: Secondary | ICD-10-CM | POA: Diagnosis not present

## 2022-06-08 DIAGNOSIS — F17210 Nicotine dependence, cigarettes, uncomplicated: Secondary | ICD-10-CM | POA: Diagnosis not present

## 2022-06-08 DIAGNOSIS — Z0000 Encounter for general adult medical examination without abnormal findings: Secondary | ICD-10-CM | POA: Diagnosis not present

## 2022-06-08 DIAGNOSIS — I10 Essential (primary) hypertension: Secondary | ICD-10-CM | POA: Diagnosis not present

## 2022-06-08 DIAGNOSIS — Z299 Encounter for prophylactic measures, unspecified: Secondary | ICD-10-CM | POA: Diagnosis not present

## 2022-06-08 DIAGNOSIS — Z7189 Other specified counseling: Secondary | ICD-10-CM | POA: Diagnosis not present

## 2022-07-03 DEATH — deceased

## 2023-09-23 ENCOUNTER — Encounter: Payer: Self-pay | Admitting: Radiology

## 2023-12-05 ENCOUNTER — Encounter: Payer: Self-pay | Admitting: Radiology
